# Patient Record
Sex: Female | Born: 1955 | Race: White | Hispanic: No | Marital: Married | Smoking: Never smoker
Health system: Southern US, Community
[De-identification: ages and names within clinical notes are randomized; demographics above are authoritative.]

## PROBLEM LIST (undated history)

## (undated) DIAGNOSIS — T7840XA Allergy, unspecified, initial encounter: Secondary | ICD-10-CM

## (undated) DIAGNOSIS — J449 Chronic obstructive pulmonary disease, unspecified: Secondary | ICD-10-CM

## (undated) DIAGNOSIS — J42 Unspecified chronic bronchitis: Secondary | ICD-10-CM

## (undated) DIAGNOSIS — I8390 Asymptomatic varicose veins of unspecified lower extremity: Secondary | ICD-10-CM

## (undated) DIAGNOSIS — L723 Sebaceous cyst: Secondary | ICD-10-CM

## (undated) DIAGNOSIS — M7130 Other bursal cyst, unspecified site: Secondary | ICD-10-CM

## (undated) DIAGNOSIS — M810 Age-related osteoporosis without current pathological fracture: Secondary | ICD-10-CM

## (undated) DIAGNOSIS — M1990 Unspecified osteoarthritis, unspecified site: Secondary | ICD-10-CM

## (undated) DIAGNOSIS — J069 Acute upper respiratory infection, unspecified: Secondary | ICD-10-CM

## (undated) DIAGNOSIS — N6019 Diffuse cystic mastopathy of unspecified breast: Secondary | ICD-10-CM

## (undated) HISTORY — DX: Asymptomatic varicose veins of unspecified lower extremity: I83.90

## (undated) HISTORY — DX: Age-related osteoporosis without current pathological fracture: M81.0

## (undated) HISTORY — DX: Other bursal cyst, unspecified site: M71.30

## (undated) HISTORY — DX: Unspecified chronic bronchitis: J42

## (undated) HISTORY — DX: Chronic obstructive pulmonary disease, unspecified: J44.9

## (undated) HISTORY — DX: Allergy, unspecified, initial encounter: T78.40XA

## (undated) HISTORY — DX: Sebaceous cyst: L72.3

## (undated) HISTORY — DX: Unspecified osteoarthritis, unspecified site: M19.90

## (undated) HISTORY — DX: Acute upper respiratory infection, unspecified: J06.9

---

## 2007-04-22 HISTORY — PX: TONSILLECTOMY AND ADENOIDECTOMY: SUR1326

## 2010-03-21 ENCOUNTER — Ambulatory Visit: Payer: Self-pay | Admitting: Family Medicine

## 2013-03-09 ENCOUNTER — Ambulatory Visit: Payer: Self-pay | Admitting: Family Medicine

## 2013-07-23 LAB — HM MAMMOGRAPHY: HM MAMMO: NORMAL

## 2015-01-27 ENCOUNTER — Encounter: Payer: Self-pay | Admitting: Family Medicine

## 2015-01-27 DIAGNOSIS — M810 Age-related osteoporosis without current pathological fracture: Secondary | ICD-10-CM | POA: Insufficient documentation

## 2015-01-27 DIAGNOSIS — J302 Other seasonal allergic rhinitis: Secondary | ICD-10-CM | POA: Insufficient documentation

## 2015-01-27 DIAGNOSIS — I83813 Varicose veins of bilateral lower extremities with pain: Secondary | ICD-10-CM | POA: Insufficient documentation

## 2015-01-27 DIAGNOSIS — J45909 Unspecified asthma, uncomplicated: Secondary | ICD-10-CM | POA: Insufficient documentation

## 2015-01-27 DIAGNOSIS — I8390 Asymptomatic varicose veins of unspecified lower extremity: Secondary | ICD-10-CM | POA: Insufficient documentation

## 2015-01-27 DIAGNOSIS — M545 Low back pain, unspecified: Secondary | ICD-10-CM | POA: Insufficient documentation

## 2015-01-28 ENCOUNTER — Encounter: Payer: Self-pay | Admitting: Family Medicine

## 2015-01-28 ENCOUNTER — Ambulatory Visit (INDEPENDENT_AMBULATORY_CARE_PROVIDER_SITE_OTHER): Payer: 59 | Admitting: Family Medicine

## 2015-01-28 VITALS — BP 132/68 | HR 83 | Temp 98.2°F | Resp 18 | Ht 65.0 in | Wt 101.0 lb

## 2015-01-28 DIAGNOSIS — J40 Bronchitis, not specified as acute or chronic: Secondary | ICD-10-CM

## 2015-01-28 MED ORDER — BENZONATATE 100 MG PO CAPS: 100.0000 mg | ORAL_CAPSULE | Freq: Three times a day (TID) | ORAL | Status: DC

## 2015-01-28 MED ORDER — FLUTICASONE FUROATE-VILANTEROL 100-25 MCG/INH IN AEPB: 1.0000 | INHALATION_SPRAY | Freq: Every day | RESPIRATORY_TRACT | Status: DC

## 2015-01-28 MED ORDER — PREDNISONE 20 MG PO TABS: 20.0000 mg | ORAL_TABLET | Freq: Two times a day (BID) | ORAL | Status: DC

## 2015-01-28 NOTE — Progress Notes (Signed)
Name: Kristina Christian   MRN: [id number]    DOB: 02-04-1956   Date:01/28/2015       Progress Note  Subjective  Chief Complaint  Chief Complaint  Patient presents with  . URI    Onset since July 4, sore throat, thick greenish mucus, cough, nasal drainage, when patient breaths her back aches, worsening. OTC-Mucinex, [place].    HPI  Bronchitis: she has a history of RAD. She states that she developed a sore throat over the holiday's followed by a productive cough, no wheezing , SOB or fever. She states that otc medication has not been helping. She states she usually needs to round of antibiotics to feel better once she gets this.   Patient Active Problem List   Diagnosis Date Noted  . LBP (low back pain) 01/27/2015  . Osteopenia 01/27/2015  . Leg varices 01/27/2015  . Allergic rhinitis, seasonal 01/27/2015  . RAD (reactive airway disease) 01/27/2015    History  Substance Use Topics  . Smoking status: Never Smoker   . Smokeless tobacco: Never Used  . Alcohol Use: No     Current outpatient prescriptions:  .  b complex vitamins capsule, Take 1 capsule by mouth daily., Disp: , Rfl:  .  BIOFLAVONOID PRODUCTS ER PO, Take by mouth., Disp: , Rfl:  .  calcium carbonate (OS-CAL) 600 MG TABS tablet, Take by mouth., Disp: , Rfl:  .  fluticasone (FLONASE) 50 MCG/ACT nasal spray, Place into the nose., Disp: , Rfl:  .  MULTIPLE VITAMINS PO, Take by mouth., Disp: , Rfl:  .  vitamin C (ASCORBIC ACID) 500 MG tablet, Take 500 mg by mouth daily., Disp: , Rfl:   No Known Allergies  ROS  Ten systems reviewed and is negative except as mentioned in HPI   Objective  Filed Vitals:   01/28/15 0946  BP: 132/68  Pulse: 83  Temp: 98.2 F (36.8 C)  TempSrc: Oral  Resp: 18  Height: 5\' 5"  (1.651 m)  Weight: 101 lb (45.813 kg)  SpO2: 97%    Body mass index is 16.81 kg/(m^2).    Physical Exam  Constitutional: Patient appears well-developed and well-nourished. No distress.  Eyes:  No  scleral icterus. PERL Neck: Normal range of motion. Neck supple. Cardiovascular: Normal rate, regular rhythm and normal heart sounds.  No murmur heard. No BLE edema. Pulmonary/Chest: Effort normal and breath sounds normal. No respiratory distress. Abdominal: Soft.  There is no tenderness. Psychiatric: Patient has a normal mood and affect. behavior is normal. Judgment and thought content normal.     Assessment & Plan  1. Bronchitis Explained that it is likely viral and will last at least 3 weeks. We will give her prednisone and inhaler to decrease inflammation, cough suppressant and call back if no improvement.  - predniSONE (DELTASONE) 20 MG tablet; Take 1 tablet (20 mg total) by mouth 2 (two) times daily with a meal.  Dispense: 10 tablet; Refill: 0 - benzonatate (TESSALON) 100 MG capsule; Take 1 capsule (100 mg total) by mouth 3 (three) times daily.  Dispense: 40 capsule; Refill: 0 - Fluticasone Furoate-Vilanterol 100-25 MCG/INH AEPB; Inhale 1 puff into the lungs daily.  Dispense: 1 each; Refill: 0

## 2015-01-28 NOTE — Patient Instructions (Signed)

## 2015-01-31 ENCOUNTER — Telehealth: Payer: Self-pay | Admitting: Family Medicine

## 2015-01-31 NOTE — Telephone Encounter (Signed)
Please advise on instructions for patient or if you would like to start her on a antibiotic.

## 2015-01-31 NOTE — Telephone Encounter (Signed)
Patient called stating she wanted to give you an update. She states the prednisone has helped with her lungs, phelm has came up until last night and now seems like it wont move. She states she would like the antibiotics to be sent to [facility]. Pt also still has a pretty bad cough. Cough meds seem to be helping some.

## 2015-02-01 ENCOUNTER — Telehealth: Payer: Self-pay | Admitting: Family Medicine

## 2015-02-01 MED ORDER — AZITHROMYCIN 250 MG PO TABS: ORAL_TABLET | ORAL | Status: DC

## 2015-02-01 NOTE — Telephone Encounter (Signed)
Tried to contact patient back regarding her concerns, but there was no answer. A message was left for her to give [place] a call back when she got the chance.

## 2015-02-01 NOTE — Telephone Encounter (Signed)
Sent zpack

## 2015-02-01 NOTE — Telephone Encounter (Signed)
Patient has some concerns about a inhaler. Please return her call.

## 2015-02-02 ENCOUNTER — Telehealth: Payer: Self-pay

## 2015-02-02 NOTE — Telephone Encounter (Signed)
I suggest her to follow up with Dr. Nadine Counts this week . Thank you

## 2015-02-02 NOTE — Telephone Encounter (Signed)
Patient notified

## 2015-02-02 NOTE — Telephone Encounter (Signed)
Could you see if patient would like to come back to see Dr. Nadine Counts, I already informed patient Kristina Christian was the best choice for her symptoms and Dr. Ancil Boozer does not feel comfortable with a antibiotic or a rescue inhaler. But if she would like she could follow up with Dr. Nadine Counts. Thanks.

## 2015-02-02 NOTE — Telephone Encounter (Signed)
Patient states Z-pak does not work for patient, but Augmentin has helped her in the past could you please switch?

## 2015-02-02 NOTE — Telephone Encounter (Signed)
Patient is worried about the Breo-reading the side effects about causing Osteopenia and patient already has it. She is worried about taking it, and worried about breathing. Could you send her in a rescue inhaler incase patient has trouble breathing with the Breo as the side effects warn patients.

## 2015-02-02 NOTE — Telephone Encounter (Signed)
She can try Zpack or just wait and use Breo, I will not switch to Augmentin at this time

## 2015-02-03 ENCOUNTER — Other Ambulatory Visit: Payer: Self-pay | Admitting: Family Medicine

## 2015-02-03 DIAGNOSIS — J209 Acute bronchitis, unspecified: Secondary | ICD-10-CM | POA: Insufficient documentation

## 2015-02-03 MED ORDER — ALBUTEROL SULFATE HFA 108 (90 BASE) MCG/ACT IN AERS: 2.0000 | INHALATION_SPRAY | RESPIRATORY_TRACT | Status: DC | PRN

## 2015-02-03 NOTE — Telephone Encounter (Signed)
Reviewed recent office visit and patient's requests. Will send in albuterol rescue inhaler.

## 2015-02-03 NOTE — Telephone Encounter (Signed)
Patient returned your call. [phone] (until [place]) [phone] (after 930)

## 2015-02-03 NOTE — Telephone Encounter (Signed)
Refill request was sent to Dr. Ashany Sundaram for approval and submission.  

## 2015-02-04 NOTE — Telephone Encounter (Signed)
On yesterday, after I consulted with Dr. Nadine Counts, I contacted patient the patient and listened to her concerns. I then sent a rx request to Dr. Nadine Counts for her to approve and submit. Patient was informed.

## 2015-02-04 NOTE — Telephone Encounter (Signed)
Inhaler sent to [place] yesterday.

## 2015-02-07 ENCOUNTER — Ambulatory Visit
Admission: RE | Admit: 2015-02-07 | Discharge: 2015-02-07 | Disposition: A | Discharge status: Home / Self Care | Payer: 59 | Source: Ambulatory Visit | Attending: Family Medicine | Admitting: Family Medicine

## 2015-02-07 ENCOUNTER — Ambulatory Visit (INDEPENDENT_AMBULATORY_CARE_PROVIDER_SITE_OTHER): Payer: 59 | Admitting: Family Medicine

## 2015-02-07 ENCOUNTER — Encounter: Payer: Self-pay | Admitting: Family Medicine

## 2015-02-07 VITALS — BP 112/68 | HR 86 | Temp 97.9°F | Resp 16 | Wt 100.6 lb

## 2015-02-07 DIAGNOSIS — J209 Acute bronchitis, unspecified: Secondary | ICD-10-CM

## 2015-02-07 DIAGNOSIS — J449 Chronic obstructive pulmonary disease, unspecified: Secondary | ICD-10-CM | POA: Diagnosis not present

## 2015-02-07 DIAGNOSIS — R0789 Other chest pain: Secondary | ICD-10-CM | POA: Diagnosis not present

## 2015-02-07 DIAGNOSIS — J029 Acute pharyngitis, unspecified: Secondary | ICD-10-CM

## 2015-02-07 DIAGNOSIS — J40 Bronchitis, not specified as acute or chronic: Secondary | ICD-10-CM

## 2015-02-07 MED ORDER — AMOXICILLIN-POT CLAVULANATE 875-125 MG PO TABS: 1.0000 | ORAL_TABLET | Freq: Two times a day (BID) | ORAL | Status: DC

## 2015-02-07 MED ORDER — PREDNISONE 10 MG (21) PO TBPK: ORAL_TABLET | ORAL | Status: DC

## 2015-02-07 NOTE — Telephone Encounter (Signed)
Patient came in for follow up with Dr. Nadine Counts today.

## 2015-02-07 NOTE — Patient Instructions (Signed)

## 2015-02-07 NOTE — Addendum Note (Signed)
Addended by: Bobetta Lime on: 02/07/2015 12:43 PM   Modules accepted: Orders, Medications

## 2015-02-07 NOTE — Progress Notes (Signed)
Name: Kristina Christian   MRN: [id number]    DOB: 1956-06-17   Date:02/07/2015       Progress Note  Subjective  Chief Complaint  Chief Complaint  Patient presents with  . Follow-up    patient is here for a re-check from 01/28/15. Patient has history of bronchitis & pneumonia.    HPI  Patient is here today with concerns regarding the following symptoms sore throat, post nasal drip, non productive cough and achiness that started months ago.  Associated with fatigue and malaise. Not associated with fevers, nausea, vomiting, unwanted weight loss, rash, sick contact. Has tried the following home remedies: Breo inhaler, Prednisone taper pack with minimal relief. Although symptoms are not getting worse. She has not been using her anti-histamine or flonase and does note waking up in the mornings with eye puffiness and scant discharge. She also notes right midline (just under breast) chest wall pain with deep breaths.     Past Medical History  Diagnosis Date  . Varicose veins of lower extremity   . Allergy   . Sebaceous cyst   . Synovial cyst   . Acute upper respiratory infection     History  Substance Use Topics  . Smoking status: Never Smoker   . Smokeless tobacco: Never Used  . Alcohol Use: No     Current outpatient prescriptions:  .  albuterol (PROVENTIL HFA;VENTOLIN HFA) 108 (90 BASE) MCG/ACT inhaler, Inhale 2 puffs into the lungs every 4 (four) hours as needed for wheezing or shortness of breath., Disp: 1 Inhaler, Rfl: 1 .  azithromycin (ZITHROMAX Z-PAK) 250 MG tablet, Take as directe, Disp: 6 each, Rfl: 0 .  b complex vitamins capsule, Take 1 capsule by mouth daily., Disp: , Rfl:  .  benzonatate (TESSALON) 100 MG capsule, Take 1 capsule (100 mg total) by mouth 3 (three) times daily., Disp: 40 capsule, Rfl: 0 .  BIOFLAVONOID PRODUCTS ER PO, Take by mouth., Disp: , Rfl:  .  calcium carbonate (OS-CAL) 600 MG TABS tablet, Take by mouth., Disp: , Rfl:  .  fluticasone (FLONASE) 50 MCG/ACT  nasal spray, Place into the nose., Disp: , Rfl:  .  Fluticasone Furoate-Vilanterol 100-25 MCG/INH AEPB, Inhale 1 puff into the lungs daily., Disp: 1 each, Rfl: 0 .  MULTIPLE VITAMINS PO, Take by mouth., Disp: , Rfl:  .  predniSONE (DELTASONE) 20 MG tablet, Take 1 tablet (20 mg total) by mouth 2 (two) times daily with a meal., Disp: 10 tablet, Rfl: 0 .  vitamin C (ASCORBIC ACID) 500 MG tablet, Take 500 mg by mouth daily., Disp: , Rfl:  .  amoxicillin-clavulanate (AUGMENTIN) 875-125 MG per tablet, Take 1 tablet by mouth 2 (two) times daily., Disp: 20 tablet, Rfl: 0  No Known Allergies  ROS  10 Systems reviewed and is negative except as mentioned in HPI.   Objective  Filed Vitals:   02/07/15 1108  BP: 112/68  Pulse: 86  Temp: 97.9 F (36.6 C)  TempSrc: Oral  Resp: 16  Weight: 100 lb 9.6 oz (45.632 kg)  SpO2: 97%   Body mass index is 16.74 kg/(m^2).   Physical Exam  Constitutional: Patient appears well-developed and well-nourished. In no acute distress but does appear to be fatigued from acute illness. HEENT:  - Head: Normocephalic and atraumatic.  - Ears: RIGHT TM bulging with minimal clear exudate, LEFT TM bulging with minimal clear exudate.  - Nose: Nasal mucosa boggy and congested.  - Mouth/Throat: Oropharynx is moist with slight erythema of bilateral  tonsils without hypertrophy or exudates. Post nasal drainage present.  - Eyes: Conjunctivae clear, EOM movements normal. PERRLA. No scleral icterus.  Neck: Normal range of motion. Neck supple. No JVD present. No thyromegaly present. No local lymphadenopathy. Cardiovascular: Regular rate, regular rhythm with no murmurs heard.  Pulmonary/Chest: Effort normal and breath sounds clear in all lung fields.  Musculoskeletal: Normal range of motion bilateral UE and LE, no joint effusions. Skin: Skin is warm and dry. No rash noted. Psychiatric: Patient has a normal mood and affect. Behavior is normal in office today. Judgment and  thought content normal in office today.   Assessment & Plan  1. Right-sided chest wall pain Likely pleuritic in nature, will get CXR to rule out infiltrate.  - DG Chest 2 View; Future  2. Sore throat Likely related to allergies, restart antihistamine and flonase.  - DG Chest 2 View; Future  3. Bronchitis with bronchospasm Will send in new abx (Zpacks do not work for her per patient and she did not pick up and use last RX of Zpack sent in 2 weeks ago) Augmentin to have on hold at the pharmacy if needed.  - amoxicillin-clavulanate (AUGMENTIN) 875-125 MG per tablet; Take 1 tablet by mouth 2 (two) times daily.  Dispense: 20 tablet; Refill: 0 - DG Chest 2 View; Future

## 2015-02-14 ENCOUNTER — Telehealth: Payer: Self-pay | Admitting: Family Medicine

## 2015-02-14 NOTE — Telephone Encounter (Signed)
Pt needs referral to [facility], Dr Philis Kendall. She does not have an appt but she says its time for her yearly eye exam.

## 2015-02-15 NOTE — Telephone Encounter (Signed)
Appt is 03/01/15.

## 2015-02-16 NOTE — Telephone Encounter (Signed)
[facility] and notified them of authorization Dr. Dawna Part, also notified the patient.  [id number] Start - 02/16/2015 Expires - 08/19/2015

## 2015-03-21 ENCOUNTER — Telehealth: Payer: Self-pay | Admitting: Family Medicine

## 2015-03-21 NOTE — Telephone Encounter (Signed)
Patient is requesting a referral to see Dr Oneta Rack at [facility]. You had seen her before for a cyst. It has become very painful. When i told her that the referral coordinator had 7 days to do the referral she became nervous and wanted to know what to do. So to ease her mind i told her that she could schedule an appointment with you for tomorrow, (and she did) but still would like the referral placed

## 2015-03-21 NOTE — Telephone Encounter (Signed)
Will evaluate patient tomorrow and place referral if needed.

## 2015-03-22 ENCOUNTER — Ambulatory Visit (INDEPENDENT_AMBULATORY_CARE_PROVIDER_SITE_OTHER): Payer: 59 | Admitting: Family Medicine

## 2015-03-22 ENCOUNTER — Encounter: Payer: Self-pay | Admitting: Family Medicine

## 2015-03-22 VITALS — BP 116/78 | HR 74 | Temp 98.1°F | Resp 16 | Ht 65.0 in | Wt 102.6 lb

## 2015-03-22 DIAGNOSIS — L723 Sebaceous cyst: Secondary | ICD-10-CM

## 2015-03-22 DIAGNOSIS — L570 Actinic keratosis: Secondary | ICD-10-CM

## 2015-03-22 DIAGNOSIS — X32XXXA Exposure to sunlight, initial encounter: Secondary | ICD-10-CM

## 2015-03-22 DIAGNOSIS — L089 Local infection of the skin and subcutaneous tissue, unspecified: Secondary | ICD-10-CM | POA: Insufficient documentation

## 2015-03-22 HISTORY — DX: Actinic keratosis: L57.0

## 2015-03-22 NOTE — Progress Notes (Deleted)
Name: Kristina Christian   MRN: [id number]    DOB: 1955-08-23   Date:03/22/2015       Progress Note  Subjective  Chief Complaint  Chief Complaint  Patient presents with  . Cyst    sebaceous cyst on the top of the right shoulder. patient stated it has grown in size and is very painful.    HPI  Kristina Christian is a 59 year old female with a know history of seasonal allergies, osteopenia, occasional low back pain and reactive airways when having URI.  She is here today with concerns regarding the following symptoms red inflammation lesion on the left/right lower/upper eye lid, swelling, mild discharge, pain of the right eye. No fevers, vomiting, headaches or visual loss but there is some blurring of vision in affected eye. Has not tried any home remedies. No positive sick contacts with similar symptoms.    Past Medical History  Diagnosis Date  . Varicose veins of lower extremity   . Allergy   . Sebaceous cyst   . Synovial cyst   . Acute upper respiratory infection     Social History  Substance Use Topics  . Smoking status: Never Smoker   . Smokeless tobacco: Never Used  . Alcohol Use: No     Current outpatient prescriptions:  .  albuterol (PROVENTIL HFA;VENTOLIN HFA) 108 (90 BASE) MCG/ACT inhaler, Inhale 2 puffs into the lungs every 4 (four) hours as needed for wheezing or shortness of breath., Disp: 1 Inhaler, Rfl: 1 .  b complex vitamins capsule, Take 1 capsule by mouth daily., Disp: , Rfl:  .  BIOFLAVONOID PRODUCTS ER PO, Take by mouth., Disp: , Rfl:  .  calcium carbonate (OS-CAL) 600 MG TABS tablet, Take by mouth., Disp: , Rfl:  .  fluticasone (FLONASE) 50 MCG/ACT nasal spray, Place into the nose., Disp: , Rfl:  .  Fluticasone Furoate-Vilanterol 100-25 MCG/INH AEPB, Inhale 1 puff into the lungs daily., Disp: 1 each, Rfl: 0 .  MULTIPLE VITAMINS PO, Take by mouth., Disp: , Rfl:  .  vitamin C (ASCORBIC ACID) 500 MG tablet, Take 500 mg by mouth daily., Disp: , Rfl:   No Known  Allergies  ROS  10 Systems reviewed and is negative except as mentioned in HPI.   Objective  Filed Vitals:   03/22/15 1514  BP: 116/78  Pulse: 74  Temp: 98.1 F (36.7 C)  TempSrc: Oral  Resp: 16  Weight: 102 lb 9.6 oz (46.539 kg)  SpO2: 98%   Body mass index is 17.07 kg/(m^2).   Physical Exam  Constitutional: Patient appears well-developed and well-nourished. In no distress.  HEENT:  - Head: Normocephalic and atraumatic.  - Ears: Bilateral TMs gray, no erythema or effusion - Nose: Nasal mucosa moist, boggy and congested. - Mouth/Throat: Oropharynx is clear and moist. No tonsillar hypertrophy or erythema. No post nasal drainage.  - Eyes: {Right/Left/Bilateral:[id number]} conjunctivae congested, injected, dull yellow discharge on eyelashes present. EOM movements normal. PERRLA. No scleral icterus.  Neck: Normal range of motion. Neck supple. No JVD present. No thyromegaly present.  Cardiovascular: Normal rate, regular rhythm and normal heart sounds.  No murmur heard.  Pulmonary/Chest: Effort normal and breath sounds normal. No respiratory distress. Musculoskeletal: Normal range of motion bilateral UE and LE, no joint effusions. Peripheral vascular: Bilateral LE no edema. Neurological: CN II-XII grossly intact with no focal deficits. Alert and oriented to person, place, and time. Coordination, balance, strength, speech and gait are normal.  Skin: Skin is warm and dry. No  rash noted. No erythema.  Psychiatric: Patient has a normal mood and affect. Behavior is normal in office today. Judgment and thought content normal in office today.   Assessment & Plan  Instructed patient on using warm/cold compressors on affect eye(s). Be mindful of washing hands frequently to avoid spreading possible infection. Tylenol or NSAID as needed for pain, inflammation, headache. If symptoms worsen contact PCP office for referral to Ophthamologist.

## 2015-03-22 NOTE — Progress Notes (Signed)
Name: Kristina Christian   MRN: [id number]    DOB: 03/12/1956   Date:03/22/2015       Progress Note  Subjective  Chief Complaint  Chief Complaint  Patient presents with  . Cyst    sebaceous cyst on the top of the right shoulder. patient stated it has grown in size and is very painful.    HPI  Kristina Christian is a 59 year old female with a known history of osteopenia, allergic rhinitis, varicose veins, reactive airways with URIs who is here today to discuss ongoing issues with recurrent cyst infection. Located right shoulder near her bra line. Onset of swelling, pain, redness 5 days ago. Not associated with spontaneous drainage or fevers or headaches.  She is interested in having the area drained but not removed as she would like to consult with Dermatology or Gen Surg if needed for cyst capsule removal.    She also wanted to mention that Breo caused her to have a soreness in her throat and so she discontinued it after 4 days. The Ventolin helped when she was sick.  Patient Active Problem List   Diagnosis Date Noted  . Infected sebaceous cyst of skin 03/22/2015  . Sun-induced skin changes, keratosis 03/22/2015  . LBP (low back pain) 01/27/2015  . Osteopenia 01/27/2015  . Leg varices 01/27/2015  . Allergic rhinitis, seasonal 01/27/2015  . RAD (reactive airway disease) 01/27/2015    Social History  Substance Use Topics  . Smoking status: Never Smoker   . Smokeless tobacco: Never Used  . Alcohol Use: No     Current outpatient prescriptions:  .  albuterol (PROVENTIL HFA;VENTOLIN HFA) 108 (90 BASE) MCG/ACT inhaler, Inhale 2 puffs into the lungs every 4 (four) hours as needed for wheezing or shortness of breath., Disp: 1 Inhaler, Rfl: 1 .  b complex vitamins capsule, Take 1 capsule by mouth daily., Disp: , Rfl:  .  BIOFLAVONOID PRODUCTS ER PO, Take by mouth., Disp: , Rfl:  .  calcium carbonate (OS-CAL) 600 MG TABS tablet, Take by mouth., Disp: , Rfl:  .  fluticasone (FLONASE) 50 MCG/ACT  nasal spray, Place into the nose., Disp: , Rfl:  .  Fluticasone Furoate-Vilanterol 100-25 MCG/INH AEPB, Inhale 1 puff into the lungs daily., Disp: 1 each, Rfl: 0 .  MULTIPLE VITAMINS PO, Take by mouth., Disp: , Rfl:  .  vitamin C (ASCORBIC ACID) 500 MG tablet, Take 500 mg by mouth daily., Disp: , Rfl:   No Known Allergies  Review of Systems  CONSTITUTIONAL: No significant weight changes, fever, chills, weakness or fatigue.  HEENT:  - Eyes: No visual changes.  - Ears: No auditory changes. No pain.  - Nose: No sneezing, congestion, runny nose. - Throat: No sore throat. No changes in swallowing. SKIN: Yes re occuring lesion. CARDIOVASCULAR: No chest pain, chest pressure or chest discomfort. No palpitations or edema.  RESPIRATORY: No shortness of breath, cough or sputum.  GASTROINTESTINAL: No anorexia, nausea, vomiting. No changes in bowel habits. No abdominal pain or blood.  NEUROLOGICAL: No headache, dizziness, syncope, paralysis, ataxia, numbness or tingling in the extremities. No memory changes. No change in bowel or bladder control.  MUSCULOSKELETAL: No joint pain. No muscle pain. HEMATOLOGIC: No anemia, bleeding or bruising.  LYMPHATICS: No enlarged lymph nodes.  PSYCHIATRIC: No change in mood. No change in sleep pattern.    Objective  BP 116/78 mmHg  Pulse 74  Temp(Src) 98.1 F (36.7 C) (Oral)  Resp 16  Wt 102 lb 9.6 oz (46.539  kg)  SpO2 98%  Body mass index is 17.07 kg/(m^2).   Physical Exam  Constitutional: Patient is lean and well-nourished. In no distress.  HEENT:  - Head: Normocephalic and atraumatic.  Neck: Normal range of motion. Neck supple. No JVD present. No thyromegaly present.  RIGHT SHOULDER LESION: over trapezius muscle just medial to bra strap a raised red, 2cm lesion with punctate pus nearly coming to a head but not opened or draining. Associated with tenderness. No induration of surrounding skin. Cardiovascular: Normal rate, regular rhythm and normal  heart sounds.  No murmur heard.  Pulmonary/Chest: Effort normal and breath sounds normal. No respiratory distress. Musculoskeletal: Normal range of motion bilateral UE and LE, no joint effusions. Peripheral vascular: Bilateral LE no edema. Neurological: CN II-XII grossly intact with no focal deficits. Alert and oriented to person, place, and time. Coordination, balance, strength, speech and gait are normal.  Skin: Skin is warm and dry with scattered macules and keratosis changes on face, chest, shoulders, back, arms.  Psychiatric: Patient has a normal mood and affect. Behavior is normal in office today. Judgment and thought content normal in office today.   Assessment & Plan  1. Infected sebaceous cyst of skin The patient complains of pain, tenderness, redness, swelling, open skin wound with discharge, infection of skin. No fevers, chills, short of breath, headache, lethargy, change in mentation, chest pain or palpitations. The patient has tried other modalities of treatment including topical neosporin, cleansing skin and/or warm compressors.    Duration of symptoms: 5 Indication for Incision and Drainage: Abscess Consent signed: YES  Procedure: Incision and drainage of abscess Location: Right shoulder Equipment used: sterile scalpel 11 blade Anesthesia: none used Cleaned and prepped: Betadine  After consent signed, affected area of skin prepped with betadine. Punctured 2 areas of prominent pus and purulent material expressed.  Cyst/abscess cavity not opened or probed.  Applied triple antibiotic over area and bandage placed. Instructed patient on home care, do not cover. May drain more. She wants to consult with Dermatology for removal but is symptoms worsen she will call me and I will place her on oral abx and consult general surgery.  - Ambulatory referral to Dermatology  2. Sun-induced skin changes, keratosis  - Ambulatory referral to Dermatology

## 2015-03-22 NOTE — Patient Instructions (Signed)
Epidermal Cyst An epidermal cyst is sometimes called a sebaceous cyst, epidermal inclusion cyst, or infundibular cyst. These cysts usually contain a substance that looks "pasty" or "cheesy" and may have a bad smell. This substance is a protein called keratin. Epidermal cysts are usually found on the face, neck, or trunk. They may also occur in the vaginal area or other parts of the genitalia of both men and women. Epidermal cysts are usually small, painless, slow-growing bumps or lumps that move freely under the skin. It is important not to try to pop them. This may cause an infection and lead to tenderness and swelling. CAUSES  Epidermal cysts may be caused by a deep penetrating injury to the skin or a plugged hair follicle, often associated with acne. SYMPTOMS  Epidermal cysts can become inflamed and cause:  Redness.  Tenderness.  Increased temperature of the skin over the bumps or lumps.  Grayish-white, bad smelling material that drains from the bump or lump. DIAGNOSIS  Epidermal cysts are easily diagnosed by your caregiver during an exam. Rarely, a tissue sample (biopsy) may be taken to rule out other conditions that may resemble epidermal cysts. TREATMENT   Epidermal cysts often get better and disappear on their own. They are rarely ever cancerous.  If a cyst becomes infected, it may become inflamed and tender. This may require opening and draining the cyst. Treatment with antibiotics may be necessary. When the infection is gone, the cyst may be removed with minor surgery.  Small, inflamed cysts can often be treated with antibiotics or by injecting steroid medicines.  Sometimes, epidermal cysts become large and bothersome. If this happens, surgical removal in your caregiver's office may be necessary. HOME CARE INSTRUCTIONS  Only take over-the-counter or prescription medicines as directed by your caregiver.  Take your antibiotics as directed. Finish them even if you start to feel  better. SEEK MEDICAL CARE IF:   Your cyst becomes tender, red, or swollen.  Your condition is not improving or is getting worse.  You have any other questions or concerns. MAKE SURE YOU:  Understand these instructions.  Will watch your condition.  Will get help right away if you are not doing well or get worse. Document Released: 06/09/2004 Document Revised: 10/01/2011 Document Reviewed: 01/15/2011 Exit[employer] Patient Information 2015 [employer]. This information is not intended to replace advice given to you by your health care provider. Make sure you discuss any questions you have with your health care provider.  

## 2015-08-05 ENCOUNTER — Telehealth: Payer: Self-pay

## 2015-08-05 NOTE — Telephone Encounter (Signed)
Patient called to speak with me about her referral with Dr. Kellie Moor back in August. She said that when she spoke with me back then I had confirmed that her referral for [facility] was done and complete, she had 6 visits to [facility] and As I look back as her Referral as I had told her back in Aug her referral was done Referral # [id number] Start date- 03/22/15 - 09/18/15. She says she has received a bill from [facility] for $600.00 b/c [facility] says a referral was never completed and as I told her in August I told her again that the referral was complete. She also someone had filed an appeal on her behalf and I stated that I had no knowledge of this and more than likely this wasn't our office as Dr. Nadine Counts has been out of the office all week. She would need to talk to Delaney Meigs about her bill since they are the one's who billed her insurance company and not our office. I simply complete the referral as requested. She understood and will follow-up with [facility].

## 2015-09-12 ENCOUNTER — Ambulatory Visit: Payer: Self-pay | Admitting: Family Medicine

## 2015-09-19 ENCOUNTER — Encounter: Payer: Self-pay | Admitting: Family Medicine

## 2015-09-19 ENCOUNTER — Ambulatory Visit (INDEPENDENT_AMBULATORY_CARE_PROVIDER_SITE_OTHER): Payer: BLUE CROSS/BLUE SHIELD | Admitting: Family Medicine

## 2015-09-19 VITALS — BP 122/84 | HR 71 | Temp 98.0°F | Resp 16 | Ht 65.0 in | Wt 107.8 lb

## 2015-09-19 DIAGNOSIS — J392 Other diseases of pharynx: Secondary | ICD-10-CM | POA: Insufficient documentation

## 2015-09-19 DIAGNOSIS — R2233 Localized swelling, mass and lump, upper limb, bilateral: Secondary | ICD-10-CM | POA: Insufficient documentation

## 2015-09-19 DIAGNOSIS — L209 Atopic dermatitis, unspecified: Secondary | ICD-10-CM | POA: Diagnosis not present

## 2015-09-19 HISTORY — DX: Localized swelling, mass and lump, upper limb, bilateral: R22.33

## 2015-09-19 MED ORDER — HYDROCORTISONE VALERATE 0.2 % EX CREA: 1.0000 "application " | TOPICAL_CREAM | Freq: Two times a day (BID) | CUTANEOUS | Status: DC

## 2015-09-19 NOTE — Progress Notes (Signed)
Name: Kristina Christian   MRN: [id number]    DOB: 19-Apr-1956   Date:09/19/2015       Progress Note  Subjective  Chief Complaint  Chief Complaint  Patient presents with  . Gastroesophageal Reflux    Feels like something is stuck in throat after eating for 4 weeks  . Hand Swelling    HPI  Kristina Christian is a 60 year old female who reports about 1 month of food stuck in back of throat sensation, left sided. May be associated with more frequent use of nasal saline spray for her allergies with post nasal drip. No choking sensation or chest pain or epigastric pain. Now symptoms resolved on their own.   She also reports nodules at the tips of some of her fingers of both hands, not painful. Mother had similar arthritis. More recently she has little red raised blisters on her right hand pinky that burst with clear fluid and resolve. Not painful. No warmth or redness.   Past Medical History  Diagnosis Date  . Varicose veins of lower extremity   . Allergy   . Sebaceous cyst   . Synovial cyst   . Acute upper respiratory infection     Patient Active Problem List   Diagnosis Date Noted  . Infected sebaceous cyst of skin 03/22/2015  . Sun-induced skin changes, keratosis 03/22/2015  . LBP (low back pain) 01/27/2015  . Osteopenia 01/27/2015  . Leg varices 01/27/2015  . Allergic rhinitis, seasonal 01/27/2015  . RAD (reactive airway disease) 01/27/2015    Social History  Substance Use Topics  . Smoking status: Never Smoker   . Smokeless tobacco: Never Used  . Alcohol Use: No     Current outpatient prescriptions:  .  albuterol (PROVENTIL HFA;VENTOLIN HFA) 108 (90 BASE) MCG/ACT inhaler, Inhale 2 puffs into the lungs every 4 (four) hours as needed for wheezing or shortness of breath., Disp: 1 Inhaler, Rfl: 1 .  b complex vitamins capsule, Take 1 capsule by mouth daily., Disp: , Rfl:  .  BIOFLAVONOID PRODUCTS ER PO, Take by mouth., Disp: , Rfl:  .  calcium carbonate (OS-CAL) 600 MG TABS tablet,  Take by mouth., Disp: , Rfl:  .  fluticasone (FLONASE) 50 MCG/ACT nasal spray, Place into the nose., Disp: , Rfl:  .  Fluticasone Furoate-Vilanterol 100-25 MCG/INH AEPB, Inhale 1 puff into the lungs daily., Disp: 1 each, Rfl: 0 .  MULTIPLE VITAMINS PO, Take by mouth., Disp: , Rfl:  .  vitamin C (ASCORBIC ACID) 500 MG tablet, Take 500 mg by mouth daily., Disp: , Rfl:   Past Surgical History  Procedure Laterality Date  . Tonsillectomy and adenoidectomy  04/22/2007    Family History  Problem Relation Age of Onset  . Von Willebrand disease Mother     No Known Allergies   Review of Systems  CONSTITUTIONAL: No significant weight changes, fever, chills, weakness or fatigue.  HEENT:  - Eyes: No visual changes.  - Ears: No auditory changes. No pain.  - Nose: No sneezing, congestion, runny nose. - Throat: No sore throat. Yes changes in swallowing. SKIN: Yes rash no itching.  CARDIOVASCULAR: No chest pain, chest pressure or chest discomfort. No palpitations or edema.  RESPIRATORY: No shortness of breath, cough or sputum.  GASTROINTESTINAL: No anorexia, nausea, vomiting. No changes in bowel habits. No abdominal pain or blood.  GENITOURINARY: No dysuria. No frequency. No discharge. NEUROLOGICAL: No headache, dizziness, syncope, paralysis, ataxia, numbness or tingling in the extremities. No memory changes. No change  in bowel or bladder control.  MUSCULOSKELETAL: No joint pain. No muscle pain. HEMATOLOGIC: No anemia, bleeding or bruising.  LYMPHATICS: No enlarged lymph nodes.  PSYCHIATRIC: No change in mood. No change in sleep pattern.  ENDOCRINOLOGIC: No reports of sweating, cold or heat intolerance. No polyuria or polydipsia.     Objective  BP 122/84 mmHg  Pulse 71  Temp(Src) 98 F (36.7 C) (Oral)  Resp 16  Ht 5\' 5"  (1.651 m)  Wt 107 lb 12.8 oz (48.898 kg)  BMI 17.94 kg/m2  SpO2 99% Body mass index is 17.94 kg/(m^2).  Physical Exam  Constitutional: Patient appears  well-developed and well-nourished. In no distress.  HEENT:  - Head: Normocephalic and atraumatic.  - Ears: Bilateral TMs gray, no erythema or effusion - Nose: Nasal mucosa moist - Mouth/Throat: Oropharynx is clear and moist. No tonsillar hypertrophy or erythema. Some post nasal drainage. No food particles in pharynx or tonsillar crypts.  - Eyes: Conjunctivae clear, EOM movements normal. PERRLA. No scleral icterus.  Neck: Normal range of motion. Neck supple. No JVD present. No thyromegaly present.  Cardiovascular: Normal rate, regular rhythm and normal heart sounds.  No murmur heard.  Pulmonary/Chest: Effort normal and breath sounds normal. No respiratory distress. Musculoskeletal: Normal range of motion bilateral UE and LE, no joint effusions. Scattered nodules on DIP joints of hands. Right hand 5th digit 3 raised slightly red lesions with clear fluid in center of one. No pus or tenderness.  Neurological: CN II-XII grossly intact with no focal deficits. Alert and oriented to person, place, and time. Coordination, balance, strength, speech and gait are normal.  Skin: Skin is warm and dry. No rash noted. No erythema.  Psychiatric: Patient has a normal mood and affect (tearful when we talked about me leaving the practice). Behavior is normal in office today. Judgment and thought content normal in office today.   Assessment & Plan  1. Atopic dermatitis Not an infective rash. 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 Avoid washing hands frequently with harsh soaps. Keep well moisturized and use topical hydrocortisone if needed.  - hydrocortisone valerate cream (WESTCORT) 0.2 %; Apply 1 application topically 2 (two) times daily.  Dispense: 15 g; Refill: 1  2. Nodule of finger of both hands Osteoarthritis most likely.   3. Pharyngeal disorder Resolved spontaneously.

## 2016-08-22 ENCOUNTER — Ambulatory Visit
Admission: RE | Admit: 2016-08-22 | Discharge: 2016-08-22 | Disposition: A | Discharge status: Home / Self Care | Payer: BLUE CROSS/BLUE SHIELD | Source: Ambulatory Visit | Attending: Family Medicine | Admitting: Family Medicine

## 2016-08-22 ENCOUNTER — Encounter: Payer: Self-pay | Admitting: Family Medicine

## 2016-08-22 ENCOUNTER — Ambulatory Visit (INDEPENDENT_AMBULATORY_CARE_PROVIDER_SITE_OTHER): Payer: BLUE CROSS/BLUE SHIELD | Admitting: Family Medicine

## 2016-08-22 ENCOUNTER — Telehealth: Payer: Self-pay | Admitting: Family Medicine

## 2016-08-22 DIAGNOSIS — M19042 Primary osteoarthritis, left hand: Secondary | ICD-10-CM | POA: Insufficient documentation

## 2016-08-22 DIAGNOSIS — J392 Other diseases of pharynx: Secondary | ICD-10-CM

## 2016-08-22 DIAGNOSIS — R2233 Localized swelling, mass and lump, upper limb, bilateral: Secondary | ICD-10-CM

## 2016-08-22 DIAGNOSIS — K1379 Other lesions of oral mucosa: Secondary | ICD-10-CM | POA: Diagnosis not present

## 2016-08-22 NOTE — Progress Notes (Signed)
BP 116/78   Pulse 82   Temp 97.8 F (36.6 C) (Oral)   Resp 16   Ht 5' 4.88" (1.648 m)   Wt 106 lb 3.2 oz (48.2 kg)   SpO2 97%   BMI 17.74 kg/m    Subjective:    Patient ID: Kristina Christian, female    DOB: [birth year], 61 y.o.   MRN: [id number]  HPI: Kristina Christian is a 61 y.o. female  Chief Complaint  Patient presents with  . Follow-up    Pt is having reocurring bumps on uvula; Pt has bumps on knuchles   Patient has been feeling bumps on the uvula; going on for over a year; they kind of simmered down, but now feeling it again Feels like something lodged in her throat and kept trying to clear her throat and it wasn't clearing; can feels these lumps She has not seen ENT Lots of sinus drainage; when it gets real dry in the winter, rarely hardly blood tinged, but not hardly this year Has had bronchitis twice a year since moving to [place]; has been using saline though and no episodes of bronchitis this year She has sinus allergies, but not sure; hay fever, eyes start itching, but no formal testing  She went through a spell when she went down to 96 pounds; she has gained it back; she got super thin  No excessive exercise; always been thin; got to 109 pounds one time; she was 102 pounds since age 27 for the most of her life  Bumps have popped up on right middle finger and left index finger; she also sees bumps on DIP Has always had really knobby knuckles; mother has arthritis; not sure if mother has RA, starting to twist a little bit  Depression screen [facility] 2/9 08/22/2016 09/19/2015 01/28/2015  Decreased Interest 0 0 0  Down, Depressed, Hopeless 0 0 0  PHQ - 2 Score 0 0 0   Relevant past medical, surgical, family and social history reviewed Past Medical History:  Diagnosis Date  . Acute upper respiratory infection   . Allergy   . Sebaceous cyst   . Synovial cyst   . Varicose veins of lower extremity    Past Surgical History:  Procedure Laterality Date  . TONSILLECTOMY AND  ADENOIDECTOMY  04/22/2007   Family History  Problem Relation Age of Onset  . Von Willebrand disease Mother    Social History  Substance Use Topics  . Smoking status: Never Smoker  . Smokeless tobacco: Never Used  . Alcohol use No   Interim medical history since last visit reviewed. Allergies and medications reviewed  Review of Systems Per HPI unless specifically indicated above     Objective:    BP 116/78   Pulse 82   Temp 97.8 F (36.6 C) (Oral)   Resp 16   Ht 5' 4.88" (1.648 m)   Wt 106 lb 3.2 oz (48.2 kg)   SpO2 97%   BMI 17.74 kg/m   Wt Readings from Last 3 Encounters:  08/22/16 106 lb 3.2 oz (48.2 kg)  09/19/15 107 lb 12.8 oz (48.9 kg)  03/22/15 102 lb 9.6 oz (46.5 kg)    Physical Exam  Constitutional: She appears well-developed and well-nourished.  HENT:  Mouth/Throat: Mucous membranes are normal.  Eyes: EOM are normal. No scleral icterus.  Neck: No tracheal deviation present. No thyromegaly present.  Cardiovascular: Normal rate and regular rhythm.   Pulmonary/Chest: Effort normal and breath sounds normal.  Musculoskeletal:  Hands: Hard nodule on flexor aspects of R3 and L2; hard nodular swelling DIP most appreciable on R5  Lymphadenopathy:    She has no cervical adenopathy.  Psychiatric: She has a normal mood and affect. Her behavior is normal.    Results for orders placed or performed in visit on 01/27/15  HM MAMMOGRAPHY  Result Value Ref Range   HM Mammogram Normal       Assessment & Plan:   Problem List Items Addressed This Visit      Respiratory   Pharyngeal disorder    Refer to ENT      Relevant Orders   Ambulatory referral to ENT   Disorder of uvula    Refer to ENT for evaluation      Relevant Orders   Ambulatory referral to ENT     Other   Nodule of finger of both hands    Will get xrays of both hands      Relevant Orders   DG Hand Complete Left (Completed)   DG Hand Complete Right (Completed)       Follow up  plan: Return for complete physical.  An after-visit summary was printed and given to the patient at [facility].  Please see the patient instructions which may contain other information and recommendations beyond what is mentioned above in the assessment and plan.  Meds ordered this encounter  Medications  . cholecalciferol (VITAMIN D) 1000 units tablet    Sig: Take 1,000 Units by mouth daily.  Marland Kitchen zinc gluconate 50 MG tablet    Sig: Take 50 mg by mouth daily.  . cetirizine (ZYRTEC) 10 MG tablet    Sig: Take 10 mg by mouth daily.  . fluticasone (FLONASE) 50 MCG/ACT nasal spray    Sig: Place 2 sprays into both nostrils daily.    Orders Placed This Encounter  Procedures  . DG Hand Complete Left  . DG Hand Complete Right  . Ambulatory referral to ENT

## 2016-08-22 NOTE — Patient Instructions (Addendum)
We'll refer you to the [facility] doctor Return for a complete physical in the next few months Go across the street to have xrays done If you have not heard anything from my staff in a week about any orders/referrals/studies from today, please contact us here to follow-up [phone]

## 2016-08-22 NOTE — Assessment & Plan Note (Signed)
Will get xrays of both hands

## 2016-08-22 NOTE — Telephone Encounter (Signed)
Patient was looking at her clinical summary and realized that you still had fluticasone furoate-vilanterol on her medication list. She would like for you to know that she only took it 5 times that she can not take this medication because it caused her to have bad sore throats. Asking that you please take it off her list.  Also patient is being referred to [facility] and is asking that you please schedule the appointment with Dr Beverly Gust

## 2016-08-22 NOTE — Assessment & Plan Note (Signed)
Refer to ENT

## 2016-08-22 NOTE — Assessment & Plan Note (Signed)
Refer to ENT for evaluation 

## 2016-09-13 NOTE — Telephone Encounter (Signed)
Went over patient AVS with patient on 08/22/2016. Took off the list and Pt has been schedule with Dr. Beverly Gust.

## 2017-06-07 ENCOUNTER — Ambulatory Visit: Payer: BLUE CROSS/BLUE SHIELD | Admitting: Family Medicine

## 2017-06-07 ENCOUNTER — Encounter: Payer: Self-pay | Admitting: Family Medicine

## 2017-06-07 DIAGNOSIS — J302 Other seasonal allergic rhinitis: Secondary | ICD-10-CM

## 2017-06-07 DIAGNOSIS — J209 Acute bronchitis, unspecified: Secondary | ICD-10-CM

## 2017-06-07 MED ORDER — PREDNISONE 20 MG PO TABS: 20.0000 mg | ORAL_TABLET | Freq: Every day | ORAL | 0 refills | Status: DC

## 2017-06-07 MED ORDER — BENZONATATE 100 MG PO CAPS: 100.0000 mg | ORAL_CAPSULE | Freq: Two times a day (BID) | ORAL | 0 refills | Status: DC | PRN

## 2017-06-07 MED ORDER — FLUTICASONE PROPIONATE 50 MCG/ACT NA SUSP: 2.0000 | NASAL | 11 refills | Status: DC | PRN

## 2017-06-07 MED ORDER — ALBUTEROL SULFATE HFA 108 (90 BASE) MCG/ACT IN AERS: 2.0000 | INHALATION_SPRAY | RESPIRATORY_TRACT | 1 refills | Status: AC | PRN

## 2017-06-07 NOTE — Assessment & Plan Note (Signed)
Refills provided of nasal corticosteroid

## 2017-06-07 NOTE — Patient Instructions (Addendum)
Start the prednisone You are contagious, so please avoid nursing homes, hospitals, day cares, etc. Use the tessalon perles if needed per directions Try vitamin C (orange juice if not diabetic or vitamin C tablets) and drink green tea to help your immune system during your illness Get plenty of rest and hydration   Acute Bronchitis, Adult Acute bronchitis is when air tubes (bronchi) in the lungs suddenly get swollen. The condition can make it hard to breathe. It can also cause these symptoms:  A cough.  Coughing up clear, yellow, or green mucus.  Wheezing.  Chest congestion.  Shortness of breath.  A fever.  Body aches.  Chills.  A sore throat.  Follow these instructions at home: Medicines  Take over-the-counter and prescription medicines only as told by your doctor.  If you were prescribed an antibiotic medicine, take it as told by your doctor. Do not stop taking the antibiotic even if you start to feel better. General instructions  Rest.  Drink enough fluids to keep your pee (urine) clear or pale yellow.  Avoid smoking and secondhand smoke. If you smoke and you need help quitting, ask your doctor. Quitting will help your lungs heal faster.  Use an inhaler, cool mist vaporizer, or humidifier as told by your doctor.  Keep all follow-up visits as told by your doctor. This is important. How is this prevented? To lower your risk of getting this condition again:  Wash your hands often with soap and water. If you cannot use soap and water, use hand sanitizer.  Avoid contact with people who have cold symptoms.  Try not to touch your hands to your mouth, nose, or eyes.  Make sure to get the flu shot every year.  Contact a doctor if:  Your symptoms do not get better in 2 weeks. Get help right away if:  You cough up blood.  You have chest pain.  You have very bad shortness of breath.  You become dehydrated.  You faint (pass out) or keep feeling like you are  going to pass out.  You keep throwing up (vomiting).  You have a very bad headache.  Your fever or chills gets worse. This information is not intended to replace advice given to you by your health care provider. Make sure you discuss any questions you have with your health care provider. Document Released: 12/26/2007 Document Revised: 02/15/2016 Document Reviewed: 12/28/2015 Elsevier Interactive Patient Education  2017 [employer].

## 2017-06-07 NOTE — Progress Notes (Signed)
BP 126/70 (BP Location: Left Arm, Patient Position: Sitting, Cuff Size: Normal)   Pulse 84   Temp 98.4 F (36.9 C) (Oral)   Ht 5' 4.88" (1.648 m)   Wt 102 lb (46.3 kg)   SpO2 95%   BMI 17.04 kg/m    Subjective:    Patient ID: Kristina Christian, female    DOB: 1956/05/07, 61 y.o.   MRN: [id number]  HPI: Kristina Christian is a 61 y.o. female  Chief Complaint  Patient presents with  . Bronchitis    Pt states thats she has this seasonally     HPI She got sick a few days ago Heavy nonproductive cough; just comes up a little bit once in a while; can't get it to come up She gets this every year, bronchitis yearly mucinex does not really help She has to keep popping ears Had some sore throat and it moves right to the chest No sinus drainage No fevers No rash No asthma as a child; never really sick; after moving from [place] to [place], sick more often Nasal allergies, needs refills of nasal corticosteroid  Depression screen [facility] 2/9 06/07/2017 08/22/2016 09/19/2015 01/28/2015  Decreased Interest 0 0 0 0  Down, Depressed, Hopeless 0 0 0 0  PHQ - 2 Score 0 0 0 0   Relevant past medical, surgical, family and social history reviewed Past Medical History:  Diagnosis Date  . Acute upper respiratory infection   . Allergy   . Sebaceous cyst   . Synovial cyst   . Varicose veins of lower extremity    Past Surgical History:  Procedure Laterality Date  . TONSILLECTOMY AND ADENOIDECTOMY  04/22/2007   Social History   Tobacco Use  . Smoking status: Never Smoker  . Smokeless tobacco: Never Used  Substance Use Topics  . Alcohol use: No    Alcohol/week: 0.0 oz  . Drug use: No   Interim medical history since last visit reviewed. Allergies and medications reviewed  Review of Systems Per HPI unless specifically indicated above     Objective:    BP 126/70 (BP Location: Left Arm, Patient Position: Sitting, Cuff Size: Normal)   Pulse 84   Temp 98.4 F (36.9 C) (Oral)   Ht 5' 4.88"  (1.648 m)   Wt 102 lb (46.3 kg)   SpO2 95%   BMI 17.04 kg/m   Wt Readings from Last 3 Encounters:  06/07/17 102 lb (46.3 kg)  08/22/16 106 lb 3.2 oz (48.2 kg)  09/19/15 107 lb 12.8 oz (48.9 kg)    Physical Exam  Constitutional: She appears well-developed and well-nourished. No distress.  HENT:  Right Ear: Tympanic membrane is not erythematous. No middle ear effusion.  Left Ear: Tympanic membrane is not erythematous.  No middle ear effusion.  Nose: Rhinorrhea present.  Mouth/Throat: Oropharynx is clear and moist and mucous membranes are normal.  Cardiovascular: Normal rate and regular rhythm.  Pulmonary/Chest: Effort normal and breath sounds normal. No respiratory distress. She has no wheezes. She has no rales.  Skin: She is not diaphoretic. No pallor.    Results for orders placed or performed in visit on 01/27/15  HM MAMMOGRAPHY  Result Value Ref Range   HM Mammogram Normal       Assessment & Plan:   Problem List Items Addressed This Visit    None    Visit Diagnoses    Bronchitis with bronchospasm       explained dx, antibiotics not recommended, lengthy discussion; prednisone, tessalon perles;  call if worsening, s/s of pneumonia, etc.   Relevant Medications   albuterol (PROVENTIL HFA;VENTOLIN HFA) 108 (90 Base) MCG/ACT inhaler       Follow up plan: Return if symptoms worsen or fail to improve.  An after-visit summary was printed and given to the patient at [facility].  Please see the patient instructions which may contain other information and recommendations beyond what is mentioned above in the assessment and plan.  Meds ordered this encounter  Medications  . benzonatate (TESSALON) 100 MG capsule    Sig: Take 1 capsule (100 mg total) 2 (two) times daily as needed by mouth for cough.    Dispense:  20 capsule    Refill:  0  . predniSONE (DELTASONE) 20 MG tablet    Sig: Take 1 tablet (20 mg total) daily with breakfast for 5 days by mouth.    Dispense:  10 tablet     Refill:  0  . albuterol (PROVENTIL HFA;VENTOLIN HFA) 108 (90 Base) MCG/ACT inhaler    Sig: Inhale 2 puffs every 4 (four) hours as needed into the lungs for wheezing or shortness of breath.    Dispense:  1 Inhaler    Refill:  1  . fluticasone (FLONASE) 50 MCG/ACT nasal spray    Sig: Place 2 sprays as needed into both nostrils.    Dispense:  16 g    Refill:  11    No orders of the defined types were placed in this encounter.

## 2017-06-11 ENCOUNTER — Encounter: Payer: Self-pay | Admitting: Family Medicine

## 2017-06-11 ENCOUNTER — Ambulatory Visit
Admission: RE | Admit: 2017-06-11 | Discharge: 2017-06-11 | Disposition: A | Discharge status: Home / Self Care | Payer: BLUE CROSS/BLUE SHIELD | Source: Ambulatory Visit | Attending: Family Medicine | Admitting: Family Medicine

## 2017-06-11 ENCOUNTER — Ambulatory Visit: Payer: BLUE CROSS/BLUE SHIELD | Admitting: Family Medicine

## 2017-06-11 VITALS — BP 118/64 | HR 86 | Temp 97.9°F | Ht 64.88 in | Wt 103.0 lb

## 2017-06-11 DIAGNOSIS — J42 Unspecified chronic bronchitis: Secondary | ICD-10-CM | POA: Insufficient documentation

## 2017-06-11 DIAGNOSIS — R059 Cough, unspecified: Secondary | ICD-10-CM

## 2017-06-11 DIAGNOSIS — M8580 Other specified disorders of bone density and structure, unspecified site: Secondary | ICD-10-CM

## 2017-06-11 DIAGNOSIS — J209 Acute bronchitis, unspecified: Secondary | ICD-10-CM | POA: Diagnosis not present

## 2017-06-11 DIAGNOSIS — R05 Cough: Secondary | ICD-10-CM | POA: Insufficient documentation

## 2017-06-11 DIAGNOSIS — S29011A Strain of muscle and tendon of front wall of thorax, initial encounter: Secondary | ICD-10-CM | POA: Diagnosis not present

## 2017-06-11 HISTORY — DX: Unspecified chronic bronchitis: J42

## 2017-06-11 MED ORDER — DOXYCYCLINE HYCLATE 100 MG PO TABS: 100.0000 mg | ORAL_TABLET | Freq: Two times a day (BID) | ORAL | 0 refills | Status: DC

## 2017-06-11 MED ORDER — HYDROCODONE-ACETAMINOPHEN 5-325 MG PO TABS: 1.0000 | ORAL_TABLET | Freq: Four times a day (QID) | ORAL | 0 refills | Status: DC | PRN

## 2017-06-11 MED ORDER — TRAMADOL HCL 50 MG PO TABS: 50.0000 mg | ORAL_TABLET | Freq: Four times a day (QID) | ORAL | 0 refills | Status: DC | PRN

## 2017-06-11 NOTE — Assessment & Plan Note (Addendum)
Stop prednisone; start doxycycline; use SABA; pain medicine for rib pain; discussed seeing pulmonologist; chest xray today and sputum culture; call me if needed

## 2017-06-11 NOTE — Assessment & Plan Note (Signed)
I suspect patient has more of a chronic bronchitis that regular viral bronchitis at this point; we discussed other considerations, such as CF (she has never had children, thin, white), other condition; discussed pulm referral, but will hold on this

## 2017-06-11 NOTE — Progress Notes (Signed)
BP 118/64 (BP Location: Right Arm, Patient Position: Standing, Cuff Size: Normal)   Pulse 86   Temp 97.9 F (36.6 C) (Oral)   Ht 5' 4.88" (1.648 m)   Wt 103 lb (46.7 kg)   SpO2 98%   BMI 17.20 kg/m    Subjective:    Patient ID: Kristina Christian, female    DOB: [birth year], 61 y.o.   MRN: [id number]  HPI: Kristina Christian is a 61 y.o. female  Chief Complaint  Patient presents with  . Cough    Pt states that she has a very bad cough, states that it is not getting any better is worse. Believes that she has torn cartilage     HPI Patient is here for an acute visit She says her cough is "very bad" She states the cough is getting worse She also thinks she has torn cartilage No cystic fibrosis; does believes she has chronic bronchitis; spring and fall, twice a year She would see her last doctor here and then have to come back a 2nd time once it got worse and then would finally get an antibiotic that started with the letter A or the letter D; both worked Has never seen a pulmonologist No frequent infections as a child, "almost never sick as a child"; no know Ig deficiency; no children; no known CF  She has osteopenia; used to have osteoporosis, and took fosamax for several years; they noted improvement and she had to stop the medicine after a while Last DEXA scan was 2014  Depression screen [facility] 2/9 06/11/2017 06/07/2017 08/22/2016 09/19/2015 01/28/2015  Decreased Interest 0 0 0 0 0  Down, Depressed, Hopeless 0 0 0 0 0  PHQ - 2 Score 0 0 0 0 0    Relevant past medical, surgical, family and social history reviewed Past Medical History:  Diagnosis Date  . Acute upper respiratory infection   . Allergy   . Chronic bronchitis ([place]) 06/11/2017  . Sebaceous cyst   . Synovial cyst   . Varicose veins of lower extremity    Past Surgical History:  Procedure Laterality Date  . TONSILLECTOMY AND ADENOIDECTOMY  04/22/2007   Family History  Problem Relation Age of Onset  . Von Willebrand  disease Mother    Social History   Socioeconomic History  . Marital status: Married    Spouse name: Not on file  . Number of children: Not on file  . Years of education: Not on file  . Highest education level: Not on file  Social Needs  . Financial resource strain: Not on file  . Food insecurity - worry: Not on file  . Food insecurity - inability: Not on file  . Transportation needs - medical: Not on file  . Transportation needs - non-medical: Not on file  Occupational History  . Not on file  Tobacco Use  . Smoking status: Never Smoker  . Smokeless tobacco: Never Used  Substance and Sexual Activity  . Alcohol use: No    Alcohol/week: 0.0 oz  . Drug use: No  . Sexual activity: Yes    Partners: Male    Birth control/protection: Post-menopausal  Other Topics Concern  . Not on file  Social History Narrative  . Not on file   Interim medical history since last visit reviewed. Allergies and medications reviewed  Review of Systems Per HPI unless specifically indicated above     Objective:    BP 118/64 (BP Location: Right Arm, Patient Position: Standing, Cuff Size: Normal)  Pulse 86   Temp 97.9 F (36.6 C) (Oral)   Ht 5' 4.88" (1.648 m)   Wt 103 lb (46.7 kg)   SpO2 98%   BMI 17.20 kg/m   Wt Readings from Last 3 Encounters:  06/11/17 103 lb (46.7 kg)  06/07/17 102 lb (46.3 kg)  08/22/16 106 lb 3.2 oz (48.2 kg)    Physical Exam  Constitutional: She appears well-developed and well-nourished. No distress (nontoxic, but uncomfortable and holding her lateral rib cage when coughing).  Cardiovascular: Normal rate and regular rhythm.  Pulmonary/Chest: Effort normal and breath sounds normal. No accessory muscle usage. No tachypnea. No respiratory distress. She has no wheezes. She has no rales.  Psychiatric: Her mood appears not anxious.   Results for orders placed or performed in visit on 01/27/15  HM MAMMOGRAPHY  Result Value Ref Range   HM Mammogram Normal         Assessment & Plan:   Problem List Items Addressed This Visit      Respiratory   Chronic bronchitis with acute exacerbation ([place])    Stop prednisone; start doxycycline; use SABA; pain medicine for rib pain; discussed seeing pulmonologist; chest xray today and sputum culture; call me if needed      Relevant Orders   Respiratory or Resp and Sputum Culture   Chronic bronchitis ([place])    I suspect patient has more of a chronic bronchitis that regular viral bronchitis at this point; we discussed other considerations, such as CF (she has never had children, thin, white), other condition; discussed pulm referral, but will hold on this        Musculoskeletal and Integument   Osteopenia    Used to take fosamax for several years; not on medicine currently; discussed DEXA scans      Relevant Orders   DG Bone Density    Other Visit Diagnoses    Cough    -  Primary   Relevant Orders   DG Chest 2 View   Respiratory or Resp and Sputum Culture   Intercostal muscle strain, initial encounter       stop prednisone; start ibuprofen; hydrocodone if needed (will help cough too); reviewed [place] web site, no fills x 2 years      Follow up plan: No Follow-up on file.  An after-visit summary was printed and given to the patient at [employer].  Please see the patient instructions which may contain other information and recommendations beyond what is mentioned above in the assessment and plan.  Meds ordered this encounter  Medications  . doxycycline (VIBRA-TABS) 100 MG tablet    Sig: Take 1 tablet (100 mg total) 2 (two) times daily by mouth.    Dispense:  20 tablet    Refill:  0  . DISCONTD: traMADol (ULTRAM) 50 MG tablet    Sig: Take 1 tablet (50 mg total) every 6 (six) hours as needed by mouth.    Dispense:  20 tablet    Refill:  0  . HYDROcodone-acetaminophen (NORCO/VICODIN) 5-325 MG tablet    Sig: Take 1 tablet every 6 (six) hours as needed by mouth for moderate pain.    Dispense:  20  tablet    Refill:  0    Orders Placed This Encounter  Procedures  . Respiratory or Resp and Sputum Culture  . DG Chest 2 View  . DG Bone Density    In accordance with the stop act, checked [place] web site prior to signing prescription; no fills in the  last 2 years; no red flags; acute pain, 5 days of medicine only

## 2017-06-11 NOTE — Assessment & Plan Note (Signed)
Used to take fosamax for several years; not on medicine currently; discussed DEXA scans

## 2017-06-11 NOTE — Patient Instructions (Addendum)
Stop the prednisone Start doxycycline Get a chest xray You can take 600 mg of ibuprofen every 6 to 8 hours, maximum dose is 2400 mg per day Always take ibuprofen with food to lessen the chance of a stomach ulcer Okay to use the pain medicine, which also has cough suppressant Call if needed We'll do further work-up if needed

## 2017-06-14 ENCOUNTER — Encounter: Payer: Self-pay | Admitting: Family Medicine

## 2017-06-14 LAB — RESPIRATORY CULTURE OR RESPIRATORY AND SPUTUM CULTURE
MICRO NUMBER:: 81310125
RESULT:: NORMAL
SPECIMEN QUALITY:: ADEQUATE

## 2017-09-26 ENCOUNTER — Other Ambulatory Visit: Payer: Self-pay | Admitting: Family Medicine

## 2017-09-26 DIAGNOSIS — Z1231 Encounter for screening mammogram for malignant neoplasm of breast: Secondary | ICD-10-CM

## 2017-09-27 ENCOUNTER — Ambulatory Visit: Payer: BLUE CROSS/BLUE SHIELD | Admitting: Family Medicine

## 2017-10-01 ENCOUNTER — Encounter: Payer: Self-pay | Admitting: Family Medicine

## 2017-10-01 ENCOUNTER — Ambulatory Visit
Admission: RE | Admit: 2017-10-01 | Discharge: 2017-10-01 | Disposition: A | Discharge status: Home / Self Care | Payer: BLUE CROSS/BLUE SHIELD | Source: Ambulatory Visit | Attending: Family Medicine | Admitting: Family Medicine

## 2017-10-01 ENCOUNTER — Ambulatory Visit: Payer: BLUE CROSS/BLUE SHIELD | Admitting: Family Medicine

## 2017-10-01 VITALS — BP 132/82 | HR 80 | Temp 97.6°F | Resp 14 | Wt 100.6 lb

## 2017-10-01 DIAGNOSIS — Q766 Other congenital malformations of ribs: Secondary | ICD-10-CM

## 2017-10-01 DIAGNOSIS — M898X8 Other specified disorders of bone, other site: Secondary | ICD-10-CM

## 2017-10-01 DIAGNOSIS — M8580 Other specified disorders of bone density and structure, unspecified site: Secondary | ICD-10-CM

## 2017-10-01 NOTE — Progress Notes (Signed)
BP 132/82   Pulse 80   Temp 97.6 F (36.4 C) (Oral)   Resp 14   Wt 100 lb 9.6 oz (45.6 kg)   SpO2 98%   BMI 16.80 kg/m    Subjective:    Patient ID: Kristina Christian, female    DOB: [birth year], 62 y.o.   MRN: [id number]  HPI: Kristina Christian is a 61 y.o. female  Chief Complaint  Patient presents with  . Cyst    left side upper chest onset yrs but now is feeling a constant pressure    HPI She is here for an acute visit When she gets bronchitis, she tears the cartilage She has some bulging areas where they never went back into place right; it happens There was another spot on the right side that came up and figured it was another place that came up It never hurt The last two weeks, she has just had a pressure; NOT pain, just a presence; it is on a bump or a knot She has tried circulating it, thought maybe something like lymph, so rubbing and lymph brush Nothing helping No nipple drainage Mammogram coming up Thursday She has weighed 102 since she was 62 years old; the few pounds of weight loss does not worry her  Depression screen [facility] 2/9 10/01/2017 06/11/2017 06/07/2017 08/22/2016 09/19/2015  Decreased Interest 0 0 0 0 0  Down, Depressed, Hopeless 0 0 0 0 0  PHQ - 2 Score 0 0 0 0 0   Relevant past medical, surgical, family and social history reviewed Past Medical History:  Diagnosis Date  . Acute upper respiratory infection   . Allergy   . Chronic bronchitis ([place]) 06/11/2017  . Sebaceous cyst   . Synovial cyst   . Varicose veins of lower extremity    Past Surgical History:  Procedure Laterality Date  . TONSILLECTOMY AND ADENOIDECTOMY  04/22/2007   Family History  Problem Relation Age of Onset  . Von Willebrand disease Mother    Social History   Tobacco Use  . Smoking status: Never Smoker  . Smokeless tobacco: Never Used  Substance Use Topics  . Alcohol use: No    Alcohol/week: 0.0 oz  . Drug use: No    Interim medical history since last visit  reviewed. Allergies and medications reviewed  Review of Systems Per HPI unless specifically indicated above     Objective:    BP 132/82   Pulse 80   Temp 97.6 F (36.4 C) (Oral)   Resp 14   Wt 100 lb 9.6 oz (45.6 kg)   SpO2 98%   BMI 16.80 kg/m   Wt Readings from Last 3 Encounters:  10/01/17 100 lb 9.6 oz (45.6 kg)  06/11/17 103 lb (46.7 kg)  06/07/17 102 lb (46.3 kg)    Physical Exam  Constitutional: She appears well-developed and well-nourished.  Thin underweight female; no distress  HENT:  Mouth/Throat: Mucous membranes are normal.  Eyes: EOM are normal. No scleral icterus.  Cardiovascular: Normal rate and regular rhythm.  Pulmonary/Chest: Effort normal and breath sounds normal. No respiratory distress. She has no wheezes. She has no rales. Right breast exhibits no inverted nipple, no mass, no nipple discharge, no skin change and no tenderness. Left breast exhibits no inverted nipple, no mass, no nipple discharge, no skin change and no tenderness. Breasts are symmetrical.    Hard bony knot on the anterior chest wall  Psychiatric: She has a normal mood and affect. Her behavior is normal.  Results for orders placed or performed in visit on 06/11/17  Respiratory or Resp and Sputum Culture  Result Value Ref Range   MICRO NUMBER: [id number]    SPECIMEN QUALITY: ADEQUATE    Source SPUTUM    STATUS: FINAL    GRAM STAIN: (A)     Many Polymorphonuclear leukocytes Rare epithelial cells Many Gram positive cocci in clusters Many Gram positive cocci in pairs Few Gram negative bacilli   RESULT: Growth of normal oropharyngeal flora.       Assessment & Plan:   Problem List Items Addressed This Visit      Musculoskeletal and Integument   Osteopenia    Encouraged patient to have DEXA scan when due, order is already in system       Other Visit Diagnoses    Abnormal prominence of rib    -  Primary   will start with plain film; proceed to CT scan if needed; do not suspect  this to be breast tissue, as it feels too bony; she does have a mammogram Thurs   Relevant Orders   DG Ribs Unilateral Right       Follow up plan: No Follow-up on file.  An after-visit summary was printed and given to the patient at [facility].  Please see the patient instructions which may contain other information and recommendations beyond what is mentioned above in the assessment and plan.  No orders of the defined types were placed in this encounter.   Orders Placed This Encounter  Procedures  . DG Ribs Unilateral Right

## 2017-10-01 NOTE — Assessment & Plan Note (Signed)
Encouraged patient to have DEXA scan when due, order is already in system

## 2017-10-01 NOTE — Patient Instructions (Addendum)
Please have the xray done today (across the street) We'll proceed with further work-up if needed Please have your dexa scan (bone density test) when due

## 2017-10-04 ENCOUNTER — Ambulatory Visit
Admission: RE | Admit: 2017-10-04 | Discharge: 2017-10-04 | Disposition: A | Discharge status: Home / Self Care | Payer: BLUE CROSS/BLUE SHIELD | Source: Ambulatory Visit | Attending: Family Medicine | Admitting: Family Medicine

## 2017-10-04 ENCOUNTER — Other Ambulatory Visit: Payer: Self-pay | Admitting: Family Medicine

## 2017-10-04 DIAGNOSIS — Z1231 Encounter for screening mammogram for malignant neoplasm of breast: Secondary | ICD-10-CM

## 2017-10-04 HISTORY — DX: Diffuse cystic mastopathy of unspecified breast: N60.19

## 2017-11-20 ENCOUNTER — Encounter: Payer: Self-pay | Admitting: Family Medicine

## 2018-01-06 ENCOUNTER — Encounter: Payer: Self-pay | Admitting: Family Medicine

## 2018-01-06 ENCOUNTER — Ambulatory Visit: Payer: BLUE CROSS/BLUE SHIELD | Admitting: Family Medicine

## 2018-01-06 VITALS — BP 134/78 | HR 76 | Temp 97.8°F | Resp 12 | Ht 65.0 in | Wt 100.6 lb

## 2018-01-06 DIAGNOSIS — R252 Cramp and spasm: Secondary | ICD-10-CM

## 2018-01-06 DIAGNOSIS — I8390 Asymptomatic varicose veins of unspecified lower extremity: Secondary | ICD-10-CM | POA: Diagnosis not present

## 2018-01-06 DIAGNOSIS — Z23 Encounter for immunization: Secondary | ICD-10-CM

## 2018-01-06 DIAGNOSIS — R636 Underweight: Secondary | ICD-10-CM

## 2018-01-06 DIAGNOSIS — E2839 Other primary ovarian failure: Secondary | ICD-10-CM | POA: Diagnosis not present

## 2018-01-06 DIAGNOSIS — M8580 Other specified disorders of bone density and structure, unspecified site: Secondary | ICD-10-CM | POA: Diagnosis not present

## 2018-01-06 DIAGNOSIS — Z0000 Encounter for general adult medical examination without abnormal findings: Secondary | ICD-10-CM

## 2018-01-06 NOTE — Patient Instructions (Addendum)
Please do call to schedule your bone density study; the number to schedule one at either [facility] or [facility] is [phone] or [phone] Let's get labs today Consider horse chestnut for vein health Please return before Sept 30th for your pap smear Consider 4 ounces of tonic water in the evenings to see if that help   Fall Prevention in the Home Falls can cause injuries and can affect people from all age groups. There are many simple things that you can do to make your home safe and to help prevent falls. What can I do on the outside of my home?  Regularly repair the edges of walkways and driveways and fix any cracks.  Remove high doorway thresholds.  Trim any shrubbery on the main path into your home.  Use bright outdoor lighting.  Clear walkways of debris and clutter, including tools and rocks.  Regularly check that handrails are securely fastened and in good repair. Both sides of any steps should have handrails.  Install guardrails along the edges of any raised decks or porches.  Have leaves, snow, and ice cleared regularly.  Use sand or salt on walkways during winter months.  In the garage, clean up any spills right away, including grease or oil spills. What can I do in the bathroom?  Use night lights.  Install grab bars by the toilet and in the tub and shower. Do not use towel bars as grab bars.  Use non-skid mats or decals on the floor of the tub or shower.  If you need to sit down while you are in the shower, use a plastic, non-slip stool.  Keep the floor dry. Immediately clean up any water that spills on the floor.  Remove soap buildup in the tub or shower on a regular basis.  Attach bath mats securely with double-sided non-slip rug tape.  Remove throw rugs and other tripping hazards from the floor. What can I do in the bedroom?  Use night lights.  Make sure that a bedside light is easy to reach.  Do not use  oversized bedding that drapes onto the floor.  Have a firm chair that has side arms to use for getting dressed.  Remove throw rugs and other tripping hazards from the floor. What can I do in the kitchen?  Clean up any spills right away.  Avoid walking on wet floors.  Place frequently used items in easy-to-reach places.  If you need to reach for something above you, use a sturdy step stool that has a grab bar.  Keep electrical cables out of the way.  Do not use floor polish or wax that makes floors slippery. If you have to use wax, make sure that it is non-skid floor wax.  Remove throw rugs and other tripping hazards from the floor. What can I do in the stairways?  Do not leave any items on the stairs.  Make sure that there are handrails on both sides of the stairs. Fix handrails that are broken or loose. Make sure that handrails are as long as the stairways.  Check any carpeting to make sure that it is firmly attached to the stairs. Fix any carpet that is loose or worn.  Avoid having throw rugs at the top or bottom of stairways, or secure the rugs with carpet tape to prevent them from moving.  Make sure that you have a light switch at the top of the stairs and the bottom of the stairs. If  you do not have them, have them installed. What are some other fall prevention tips?  Wear closed-toe shoes that fit well and support your feet. Wear shoes that have rubber soles or low heels.  When you use a stepladder, make sure that it is completely opened and that the sides are firmly locked. Have someone hold the ladder while you are using it. Do not climb a closed stepladder.  Add color or contrast paint or tape to grab bars and handrails in your home. Place contrasting color strips on the first and last steps.  Use mobility aids as needed, such as canes, walkers, scooters, and crutches.  Turn on lights if it is dark. Replace any light bulbs that burn out.  Set up furniture so that  there are clear paths. Keep the furniture in the same spot.  Fix any uneven floor surfaces.  Choose a carpet design that does not hide the edge of steps of a stairway.  Be aware of any and all pets.  Review your medicines with your healthcare provider. Some medicines can cause dizziness or changes in blood pressure, which increase your risk of falling. Talk with your health care provider about other ways that you can decrease your risk of falls. This may include working with a physical therapist or trainer to improve your strength, balance, and endurance. This information is not intended to replace advice given to you by your health care provider. Make sure you discuss any questions you have with your health care provider. Document Released: 06/29/2002 Document Revised: 12/06/2015 Document Reviewed: 08/13/2014 Elsevier Interactive Patient Education  [employer].

## 2018-01-06 NOTE — Assessment & Plan Note (Addendum)
Check vit D and calcium; add on phos and iPTH if needed; fall precautions discussed; see AVS; patient agrees to get DEXA done

## 2018-01-06 NOTE — Assessment & Plan Note (Signed)
Consider horse chestnut; can refer to vasc if needed; I explained that I don't think her circulation issue is arterial, but rather vein issue

## 2018-01-06 NOTE — Progress Notes (Signed)
BP 134/78   Pulse 76   Temp 97.8 F (36.6 C) (Oral)   Resp 12   Ht 5\' 5"  (1.651 m)   Wt 100 lb 9.6 oz (45.6 kg)   SpO2 96%   BMI 16.74 kg/m    Subjective:    Patient ID: Kristina Christian, female    DOB: 11/22/1955, 62 y.o.   MRN: [id number]  HPI: Kristina Christian is a 62 y.o. female  Chief Complaint  Patient presents with  . Leg Pain    cramping in hands, feet and legs for years getting worse at night    HPI Patient is here for an acute problem She has been having cramping in her hands and her feet Going on for years Worse at night Started in the feet many years ago, toe and arch would get rigid, or big toe would go off Legs are mostly affected in the calf and sometimes in the thighs No one in the family that she knows of has things No diuretics at all Good veggie eater; lots of fruits and greens; keeps her proteins up She can eat bananas and it doesn't help Never tried quinine Never tried tonic water Was told she had an irregular heartbeat, but wore a holter monitor and nothing worrisome showed up She is underweight; she is usually 102 pounds at home and her scale Osteopenia; last DEXA 2014; getting green leafy; fall precautions Bad circulation runs in her mothers side of the family; has always had bad circulation; spider veins since age 59  Depression screen PH[facility] 2/9 01/06/2018 10/01/2017 06/11/2017 06/07/2017 08/22/2016  Decreased Interest 0 0 0 0 0  Down, Depressed, Hopeless 0 0 0 0 0  PHQ - 2 Score 0 0 0 0 0    Relevant past medical, surgical, family and social history reviewed Past Medical History:  Diagnosis Date  . Acute upper respiratory infection   . Allergy   . Chronic bronchitis ([place]) 06/11/2017  . Fibrocystic breast   . Sebaceous cyst   . Synovial cyst   . Varicose veins of lower extremity    Past Surgical History:  Procedure Laterality Date  . TONSILLECTOMY AND ADENOIDECTOMY  04/22/2007   Family History  Problem Relation Age of Onset  . Von  Willebrand disease Mother    Social History   Tobacco Use  . Smoking status: Never Smoker  . Smokeless tobacco: Never Used  Substance Use Topics  . Alcohol use: No    Alcohol/week: 0.0 oz  . Drug use: No    Interim medical history since last visit reviewed. Allergies and medications reviewed  Review of Systems Per HPI unless specifically indicated above     Objective:    BP 134/78   Pulse 76   Temp 97.8 F (36.6 C) (Oral)   Resp 12   Ht 5\' 5"  (1.651 m)   Wt 100 lb 9.6 oz (45.6 kg)   SpO2 96%   BMI 16.74 kg/m   Wt Readings from Last 3 Encounters:  01/06/18 100 lb 9.6 oz (45.6 kg)  10/01/17 100 lb 9.6 oz (45.6 kg)  06/11/17 103 lb (46.7 kg)    Physical Exam  Constitutional: She appears well-developed and well-nourished.  Thin, underweight; NAD  HENT:  Mouth/Throat: Mucous membranes are normal.  Eyes: EOM are normal. No scleral icterus.  Cardiovascular: Normal rate and regular rhythm.  Pulses:      Dorsalis pedis pulses are 1+ on the right side, and 1+ on the left side.  Feet  are warm and appear well-perfused  Pulmonary/Chest: Effort normal and breath sounds normal.  Neurological: She displays no tremor.  Reflex Scores:      Patellar reflexes are 3+ on the right side and 3+ on the left side. No muscle fasciculation seen on the forearms  Skin: No pallor.  Numerous spider veins bilateral lower legs  Psychiatric: She has a normal mood and affect. Her behavior is normal. Her mood appears not anxious. She does not exhibit a depressed mood.    Results for orders placed or performed in visit on 06/11/17  Respiratory or Resp and Sputum Culture  Result Value Ref Range   MICRO NUMBER: [id number]    SPECIMEN QUALITY: ADEQUATE    Source SPUTUM    STATUS: FINAL    GRAM STAIN: (A)     Many Polymorphonuclear leukocytes Rare epithelial cells Many Gram positive cocci in clusters Many Gram positive cocci in pairs Few Gram negative bacilli   RESULT: Growth of normal  oropharyngeal flora.       Assessment & Plan:   Problem List Items Addressed This Visit      Cardiovascular and Mediastinum   Spider vein of lower extremity    Consider horse chestnut; can refer to vasc if needed; I explained that I don't think her circulation issue is arterial, but rather vein issue        Musculoskeletal and Integument   Osteopenia    Check vit D and calcium; add on phos and iPTH if needed; fall precautions discussed; see AVS; patient agrees to get DEXA done      Relevant Orders   DG Bone Density   VITAMIN D 25 Hydroxy (Vit-D Deficiency, Fractures)    Other Visit Diagnoses    Cramps of lower extremity    -  Primary   will check Mg2+, K+, Ca2+; suggested tonic water; continue work-up if nothing obvious   Relevant Orders   Magnesium   Estrogen deficiency       check DEXA   Relevant Orders   DG Bone Density   Underweight       Relevant Orders   DG Bone Density   Need for Tdap vaccination       offered, given today   Relevant Orders   Tdap vaccine greater than or equal to 7yo IM (Completed)   Preventative health care       encouraged patient to return for CPE; she'll schedule; will get labs today since we're drawing for other bloodwork   Relevant Orders   CBC with Differential/Platelet   COMPLETE METABOLIC PANEL WITH GFR   Lipid panel   TSH       Follow up plan: Return in about 8 weeks (around 03/03/2018) for complete physical.  An after-visit summary was printed and given to the patient at [facility].  Please see the patient instructions which may contain other information and recommendations beyond what is mentioned above in the assessment and plan.  No orders of the defined types were placed in this encounter.   Orders Placed This Encounter  Procedures  . DG Bone Density  . Tdap vaccine greater than or equal to 7yo IM  . CBC with Differential/Platelet  . COMPLETE METABOLIC PANEL WITH GFR  . Lipid panel  . TSH  . VITAMIN D 25 Hydroxy  (Vit-D Deficiency, Fractures)  . Magnesium

## 2018-01-07 LAB — LIPID PANEL
CHOL/HDL RATIO: 2.6 (calc) (ref <5.0)
Cholesterol: 182 mg/dL (ref <200)
HDL: 71 mg/dL (ref >50)
LDL CHOLESTEROL (CALC): 94 mg/dL
NON-HDL CHOLESTEROL (CALC): 111 mg/dL (ref <130)
Triglycerides: 77 mg/dL (ref <150)

## 2018-01-07 LAB — COMPLETE METABOLIC PANEL WITH GFR
AG Ratio: 2 (calc) (ref 1.0–2.5)
ALBUMIN MSPROF: 4.8 g/dL (ref 3.6–5.1)
ALT: 14 U/L (ref 6–29)
AST: 17 U/L (ref 10–35)
Alkaline phosphatase (APISO): 76 U/L (ref 33–130)
BILIRUBIN TOTAL: 0.6 mg/dL (ref 0.2–1.2)
BUN: 25 mg/dL (ref 7–25)
CALCIUM: 9.7 mg/dL (ref 8.6–10.4)
CO2: 29 mmol/L (ref 20–32)
CREATININE: 0.72 mg/dL (ref 0.50–0.99)
Chloride: 104 mmol/L (ref 98–110)
GFR, EST AFRICAN AMERICAN: 105 mL/min/{1.73_m2} (ref >=60)
GFR, EST NON AFRICAN AMERICAN: 90 mL/min/{1.73_m2} (ref >=60)
GLUCOSE: 86 mg/dL (ref 65–139)
Globulin: 2.4 g/dL (calc) (ref 1.9–3.7)
Potassium: 4.2 mmol/L (ref 3.5–5.3)
Sodium: 141 mmol/L (ref 135–146)
TOTAL PROTEIN: 7.2 g/dL (ref 6.1–8.1)

## 2018-01-07 LAB — VITAMIN D 25 HYDROXY (VIT D DEFICIENCY, FRACTURES): VIT D 25 HYDROXY: 43 ng/mL (ref 30–100)

## 2018-01-07 LAB — CBC WITH DIFFERENTIAL/PLATELET
BASOS ABS: 31 {cells}/uL (ref 0–200)
Basophils Relative: 0.6 %
EOS ABS: 82 {cells}/uL (ref 15–500)
EOS PCT: 1.6 %
HEMATOCRIT: 40.4 % (ref 35.0–45.0)
Hemoglobin: 13.7 g/dL (ref 11.7–15.5)
LYMPHS ABS: 1127 {cells}/uL (ref 850–3900)
MCH: 32.9 pg (ref 27.0–33.0)
MCHC: 33.9 g/dL (ref 32.0–36.0)
MCV: 96.9 fL (ref 80.0–100.0)
MPV: 10.8 fL (ref 7.5–12.5)
Monocytes Relative: 10.9 %
NEUTROS PCT: 64.8 %
Neutro Abs: 3305 cells/uL (ref 1500–7800)
Platelets: 196 10*3/uL (ref 140–400)
RBC: 4.17 10*6/uL (ref 3.80–5.10)
RDW: 11.6 % (ref 11.0–15.0)
Total Lymphocyte: 22.1 %
WBC mixed population: 556 cells/uL (ref 200–950)
WBC: 5.1 10*3/uL (ref 3.8–10.8)

## 2018-01-07 LAB — TSH: TSH: 2.1 m[IU]/L (ref 0.40–4.50)

## 2018-01-07 LAB — MAGNESIUM: Magnesium: 2.4 mg/dL (ref 1.5–2.5)

## 2018-01-14 ENCOUNTER — Encounter: Payer: Self-pay | Admitting: Family Medicine

## 2018-01-22 ENCOUNTER — Telehealth: Payer: Self-pay

## 2018-01-22 NOTE — Telephone Encounter (Signed)
Pt called back to state she received one in the mail

## 2018-01-22 NOTE — Telephone Encounter (Signed)
Copied from [place] [phone] [id number]. Topic: General - Other >> Jan 22, 2018  8:58 AM Boyd Kerbs wrote: Reason for CRM:  pt. Is calling about lab results - she had asked for a hard copy and is asking for someone to call her when she can come by and pick it up  Please call patient and let her know labs are ready for pick up.

## 2018-03-17 ENCOUNTER — Encounter: Payer: Self-pay | Admitting: Nurse Practitioner

## 2018-03-17 ENCOUNTER — Ambulatory Visit
Admission: RE | Admit: 2018-03-17 | Discharge: 2018-03-17 | Disposition: A | Discharge status: Home / Self Care | Payer: BLUE CROSS/BLUE SHIELD | Source: Ambulatory Visit | Attending: Family Medicine | Admitting: Family Medicine

## 2018-03-17 DIAGNOSIS — M8580 Other specified disorders of bone density and structure, unspecified site: Secondary | ICD-10-CM | POA: Diagnosis present

## 2018-03-17 DIAGNOSIS — R636 Underweight: Secondary | ICD-10-CM | POA: Insufficient documentation

## 2018-03-17 DIAGNOSIS — E2839 Other primary ovarian failure: Secondary | ICD-10-CM | POA: Diagnosis present

## 2018-03-19 ENCOUNTER — Encounter: Payer: Self-pay | Admitting: Nurse Practitioner

## 2018-03-19 ENCOUNTER — Other Ambulatory Visit: Payer: Self-pay | Admitting: Nurse Practitioner

## 2018-03-19 DIAGNOSIS — M810 Age-related osteoporosis without current pathological fracture: Secondary | ICD-10-CM

## 2018-04-08 ENCOUNTER — Encounter: Payer: Self-pay | Admitting: Family Medicine

## 2018-04-10 ENCOUNTER — Telehealth: Payer: Self-pay

## 2018-04-10 MED ORDER — ALENDRONATE SODIUM 70 MG PO TABS: 70.0000 mg | ORAL_TABLET | ORAL | 11 refills | Status: DC

## 2018-04-10 NOTE — Telephone Encounter (Signed)
I just saw her MyChart message I'm happy to prescribe Fosamax and forego the endo appt for now We'll want to recheck DEXA in one year instead of two years to assess response to therapy

## 2018-04-10 NOTE — Telephone Encounter (Signed)
Left detailed voicemail

## 2018-04-10 NOTE — Telephone Encounter (Signed)
Copied from [place] [phone]. Topic: Referral - Question >> Apr 10, 2018  1:43 PM Alfredia Ferguson R wrote: Pt states she is still in the works with going to endocrinologist shes trying to get Dr Sanda Klein to prescribe her fosomax

## 2018-04-10 NOTE — Addendum Note (Signed)
Addended by: LADA, Satira Anis on: 04/10/2018 02:15 PM   Modules accepted: Orders

## 2018-04-22 ENCOUNTER — Encounter: Payer: Self-pay | Admitting: Family Medicine

## 2018-04-22 ENCOUNTER — Ambulatory Visit: Payer: BLUE CROSS/BLUE SHIELD | Admitting: Family Medicine

## 2018-04-22 ENCOUNTER — Other Ambulatory Visit: Payer: Self-pay | Admitting: Family Medicine

## 2018-04-22 VITALS — BP 108/62 | HR 89 | Temp 97.9°F | Ht 64.0 in | Wt 99.6 lb

## 2018-04-22 DIAGNOSIS — Z0000 Encounter for general adult medical examination without abnormal findings: Secondary | ICD-10-CM

## 2018-04-22 DIAGNOSIS — R310 Gross hematuria: Secondary | ICD-10-CM

## 2018-04-22 DIAGNOSIS — Z124 Encounter for screening for malignant neoplasm of cervix: Secondary | ICD-10-CM | POA: Diagnosis not present

## 2018-04-22 DIAGNOSIS — Z1211 Encounter for screening for malignant neoplasm of colon: Secondary | ICD-10-CM | POA: Diagnosis not present

## 2018-04-22 NOTE — Patient Instructions (Addendum)
Let me know if you would like a short trial of gabapentin  I do recommend yearly flu shots; for individuals who don't want flu shots, try to practice excellent hand hygiene, and avoid nursing homes, day cares, and hospitals during peak flu season; taking additional vitamin C daily during flu/cold season may help boost your immune system too  Health Maintenance, Female Adopting a healthy lifestyle and getting preventive care can go a long way to promote health and wellness. Talk with your health care provider about what schedule of regular examinations is right for you. This is a good chance for you to check in with your provider about disease prevention and staying healthy. In between checkups, there are plenty of things you can do on your own. Experts have done a lot of research about which lifestyle changes and preventive measures are most likely to keep you healthy. Ask your health care provider for more information. Weight and diet Eat a healthy diet  Be sure to include plenty of vegetables, fruits, low-fat dairy products, and lean protein.  Do not eat a lot of foods high in solid fats, added sugars, or salt.  Get regular exercise. This is one of the most important things you can do for your health. ? Most adults should exercise for at least 150 minutes each week. The exercise should increase your heart rate and make you sweat (moderate-intensity exercise). ? Most adults should also do strengthening exercises at least twice a week. This is in addition to the moderate-intensity exercise.  Maintain a healthy weight  Body mass index (BMI) is a measurement that can be used to identify possible weight problems. It estimates body fat based on height and weight. Your health care provider can help determine your BMI and help you achieve or maintain a healthy weight.  For females 80 years of age and older: ? A BMI below 18.5 is considered underweight. ? A BMI of 18.5 to 24.9 is normal. ? A BMI of  25 to 29.9 is considered overweight. ? A BMI of 30 and above is considered obese.  Watch levels of cholesterol and blood lipids  You should start having your blood tested for lipids and cholesterol at 62 years of age, then have this test every 5 years.  You may need to have your cholesterol levels checked more often if: ? Your lipid or cholesterol levels are high. ? You are older than 62 years of age. ? You are at high risk for heart disease.  Cancer screening Lung Cancer  Lung cancer screening is recommended for adults 32-68 years old who are at high risk for lung cancer because of a history of smoking.  A yearly low-dose CT scan of the lungs is recommended for people who: ? Currently smoke. ? Have quit within the past 15 years. ? Have at least a 30-pack-year history of smoking. A pack year is smoking an average of one pack of cigarettes a day for 1 year.  Yearly screening should continue until it has been 15 years since you quit.  Yearly screening should stop if you develop a health problem that would prevent you from having lung cancer treatment.  Breast Cancer  Practice breast self-awareness. This means understanding how your breasts normally appear and feel.  It also means doing regular breast self-exams. Let your health care provider know about any changes, no matter how small.  If you are in your 20s or 30s, you should have a clinical breast exam (CBE) by a  health care provider every 1-3 years as part of a regular health exam.  If you are 46 or older, have a CBE every year. Also consider having a breast X-ray (mammogram) every year.  If you have a family history of breast cancer, talk to your health care provider about genetic screening.  If you are at high risk for breast cancer, talk to your health care provider about having an MRI and a mammogram every year.  Breast cancer gene (BRCA) assessment is recommended for women who have family members with BRCA-related  cancers. BRCA-related cancers include: ? Breast. ? Ovarian. ? Tubal. ? Peritoneal cancers.  Results of the assessment will determine the need for genetic counseling and BRCA1 and BRCA2 testing.  Cervical Cancer Your health care provider may recommend that you be screened regularly for cancer of the pelvic organs (ovaries, uterus, and vagina). This screening involves a pelvic examination, including checking for microscopic changes to the surface of your cervix (Pap test). You may be encouraged to have this screening done every 3 years, beginning at age 26.  For women ages 24-65, health care providers may recommend pelvic exams and Pap testing every 3 years, or they may recommend the Pap and pelvic exam, combined with testing for human papilloma virus (HPV), every 5 years. Some types of HPV increase your risk of cervical cancer. Testing for HPV may also be done on women of any age with unclear Pap test results.  Other health care providers may not recommend any screening for nonpregnant women who are considered low risk for pelvic cancer and who do not have symptoms. Ask your health care provider if a screening pelvic exam is right for you.  If you have had past treatment for cervical cancer or a condition that could lead to cancer, you need Pap tests and screening for cancer for at least 20 years after your treatment. If Pap tests have been discontinued, your risk factors (such as having a new sexual partner) need to be reassessed to determine if screening should resume. Some women have medical problems that increase the chance of getting cervical cancer. In these cases, your health care provider may recommend more frequent screening and Pap tests.  Colorectal Cancer  This type of cancer can be detected and often prevented.  Routine colorectal cancer screening usually begins at 62 years of age and continues through 62 years of age.  Your health care provider may recommend screening at an  earlier age if you have risk factors for colon cancer.  Your health care provider may also recommend using home test kits to check for hidden blood in the stool.  A small camera at the end of a tube can be used to examine your colon directly (sigmoidoscopy or colonoscopy). This is done to check for the earliest forms of colorectal cancer.  Routine screening usually begins at age 89.  Direct examination of the colon should be repeated every 5-10 years through 62 years of age. However, you may need to be screened more often if early forms of precancerous polyps or small growths are found.  Skin Cancer  Check your skin from head to toe regularly.  Tell your health care provider about any new moles or changes in moles, especially if there is a change in a mole's shape or color.  Also tell your health care provider if you have a mole that is larger than the size of a pencil eraser.  Always use sunscreen. Apply sunscreen liberally and repeatedly throughout  the day.  Protect yourself by wearing long sleeves, pants, a wide-brimmed hat, and sunglasses whenever you are outside.  Heart disease, diabetes, and high blood pressure  High blood pressure causes heart disease and increases the risk of stroke. High blood pressure is more likely to develop in: ? People who have blood pressure in the high end of the normal range (130-139/85-89 mm Hg). ? People who are overweight or obese. ? People who are African American.  If you are 70-1 years of age, have your blood pressure checked every 3-5 years. If you are 28 years of age or older, have your blood pressure checked every year. You should have your blood pressure measured twice-once when you are at a hospital or clinic, and once when you are not at a hospital or clinic. Record the average of the two measurements. To check your blood pressure when you are not at a hospital or clinic, you can use: ? An automated blood pressure machine at a  pharmacy. ? A home blood pressure monitor.  If you are between 44 years and 40 years old, ask your health care provider if you should take aspirin to prevent strokes.  Have regular diabetes screenings. This involves taking a blood sample to check your fasting blood sugar level. ? If you are at a normal weight and have a low risk for diabetes, have this test once every three years after 62 years of age. ? If you are overweight and have a high risk for diabetes, consider being tested at a younger age or more often. Preventing infection Hepatitis B  If you have a higher risk for hepatitis B, you should be screened for this virus. You are considered at high risk for hepatitis B if: ? You were born in a country where hepatitis B is common. Ask your health care provider which countries are considered high risk. ? Your parents were born in a high-risk country, and you have not been immunized against hepatitis B (hepatitis B vaccine). ? You have HIV or AIDS. ? You use needles to inject street drugs. ? You live with someone who has hepatitis B. ? You have had sex with someone who has hepatitis B. ? You get hemodialysis treatment. ? You take certain medicines for conditions, including cancer, organ transplantation, and autoimmune conditions.  Hepatitis C  Blood testing is recommended for: ? Everyone born from [birth year] through 1965. ? Anyone with known risk factors for hepatitis C.  Sexually transmitted infections (STIs)  You should be screened for sexually transmitted infections (STIs) including gonorrhea and chlamydia if: ? You are sexually active and are younger than 62 years of age. ? You are older than 62 years of age and your health care provider tells you that you are at risk for this type of infection. ? Your sexual activity has changed since you were last screened and you are at an increased risk for chlamydia or gonorrhea. Ask your health care provider if you are at risk.  If you do not  have HIV, but are at risk, it may be recommended that you take a prescription medicine daily to prevent HIV infection. This is called pre-exposure prophylaxis (PrEP). You are considered at risk if: ? You are sexually active and do not regularly use condoms or know the HIV status of your partner(s). ? You take drugs by injection. ? You are sexually active with a partner who has HIV.  Talk with your health care provider about whether you are at  high risk of being infected with HIV. If you choose to begin PrEP, you should first be tested for HIV. You should then be tested every 3 months for as long as you are taking PrEP. Pregnancy  If you are premenopausal and you may become pregnant, ask your health care provider about preconception counseling.  If you may become pregnant, take 400 to 800 micrograms (mcg) of folic acid every day.  If you want to prevent pregnancy, talk to your health care provider about birth control (contraception). Osteoporosis and menopause  Osteoporosis is a disease in which the bones lose minerals and strength with aging. This can result in serious bone fractures. Your risk for osteoporosis can be identified using a bone density scan.  If you are 60 years of age or older, or if you are at risk for osteoporosis and fractures, ask your health care provider if you should be screened.  Ask your health care provider whether you should take a calcium or vitamin D supplement to lower your risk for osteoporosis.  Menopause may have certain physical symptoms and risks.  Hormone replacement therapy may reduce some of these symptoms and risks. Talk to your health care provider about whether hormone replacement therapy is right for you. Follow these instructions at home:  Schedule regular health, dental, and eye exams.  Stay current with your immunizations.  Do not use any tobacco products including cigarettes, chewing tobacco, or electronic cigarettes.  If you are pregnant,  do not drink alcohol.  If you are breastfeeding, limit how much and how often you drink alcohol.  Limit alcohol intake to no more than 1 drink per day for nonpregnant women. One drink equals 12 ounces of beer, 5 ounces of wine, or 1 ounces of hard liquor.  Do not use street drugs.  Do not share needles.  Ask your health care provider for help if you need support or information about quitting drugs.  Tell your health care provider if you often feel depressed.  Tell your health care provider if you have ever been abused or do not feel safe at home. This information is not intended to replace advice given to you by your health care provider. Make sure you discuss any questions you have with your health care provider. Document Released: 01/22/2011 Document Revised: 12/15/2015 Document Reviewed: 04/12/2015 Elsevier Interactive Patient Education  [employer].

## 2018-04-22 NOTE — Progress Notes (Signed)
BP 108/62   Pulse 89   Temp 97.9 F (36.6 C)   Ht 5' 4"  (1.626 m)   Wt 99 lb 9.6 oz (45.2 kg)   SpO2 99%   BMI 17.10 kg/m    Subjective:    Patient ID: Kristina Christian, female    DOB: April 22, 1956, 62 y.o.   MRN: [id number]  HPI: Kristina Christian is a 62 y.o. female  Chief Complaint  Patient presents with  . Annual Exam    HPI Here for her physical Not many cramps for weeks; tonic water helped the first night; taking magnesium   USPSTF grade A and B recommendations Depression:  Depression screen [facility] 2/9 04/22/2018 01/06/2018 10/01/2017 06/11/2017 06/07/2017  Decreased Interest 0 0 0 0 0  Down, Depressed, Hopeless 0 0 0 0 0  PHQ - 2 Score 0 0 0 0 0  Altered sleeping 0 - - - -  Tired, decreased energy 0 - - - -  Change in appetite 0 - - - -  Feeling bad or failure about yourself  0 - - - -  Trouble concentrating 0 - - - -  Moving slowly or fidgety/restless 0 - - - -  Suicidal thoughts 0 - - - -  PHQ-9 Score 0 - - - -   Hypertension: BP Readings from Last 3 Encounters:  04/22/18 108/62  01/06/18 134/78  10/01/17 132/82   Obesity: Wt Readings from Last 3 Encounters:  04/22/18 99 lb 9.6 oz (45.2 kg)  01/06/18 100 lb 9.6 oz (45.6 kg)  10/01/17 100 lb 9.6 oz (45.6 kg)   BMI Readings from Last 3 Encounters:  04/22/18 17.10 kg/m  01/06/18 16.74 kg/m  10/01/17 16.80 kg/m     Skin cancer: gets checked out by derm Lung cancer:  Never smoker Breast cancer: just had mammogram in March 2019 Colorectal cancer: agrees with referral Cervical cancer screening: today; no abnormals in the past BRCA gene screening: family hx of breast and/or ovarian cancer and/or metastatic prostate cancer? no HIV, hep B, hep C: no STD testing and prevention (chl/gon/syphilis): no Intimate partner violence: no abuse Contraception: n/a Osteoporosis: yes, just had DEXA; back on fosamax, just one week Fall prevention/vitamin D: discussed, taking 1,000 iu vit D3 daily Immunizations: does not  want flu shots; tetanus UTD Diet: good eater Exercise: lots Alcohol:    [facility] Visit from 04/22/2018 in [facility]  AUDIT-C Score  0     Tobacco use: never AAA: n/a Aspirin: The 10-year ASCVD risk score Mikey Bussing DC Jr., et al., 2013) is: 2.4%   Values used to calculate the score:     Age: 45 years     Sex: Female     Is Non-Hispanic African American: No     Diabetic: No     Tobacco smoker: No     Systolic Blood Pressure: 397 mmHg     Is BP treated: No     HDL Cholesterol: 71 mg/dL     Total Cholesterol: 182 mg/dL  Glucose:  Glucose, Bld  Date Value Ref Range Status  01/06/2018 86 65 - 139 mg/dL Final    Comment:    .        Non-fasting reference interval .    Lipids:  Lab Results  Component Value Date   CHOL 182 01/06/2018   Lab Results  Component Value Date   HDL 71 01/06/2018   Lab Results  Component Value Date   LDLCALC 94 01/06/2018   Lab  Results  Component Value Date   TRIG 77 01/06/2018   Lab Results  Component Value Date   CHOLHDL 2.6 01/06/2018   No results found for: LDLDIRECT   Depression screen [facility] 2/9 04/22/2018 01/06/2018 10/01/2017 06/11/2017 06/07/2017  Decreased Interest 0 0 0 0 0  Down, Depressed, Hopeless 0 0 0 0 0  PHQ - 2 Score 0 0 0 0 0  Altered sleeping 0 - - - -  Tired, decreased energy 0 - - - -  Change in appetite 0 - - - -  Feeling bad or failure about yourself  0 - - - -  Trouble concentrating 0 - - - -  Moving slowly or fidgety/restless 0 - - - -  Suicidal thoughts 0 - - - -  PHQ-9 Score 0 - - - -   Fall Risk  04/22/2018 01/06/2018 10/01/2017 06/11/2017 06/07/2017  Falls in the past year? No No No No No    Relevant past medical, surgical, family and social history reviewed Past Medical History:  Diagnosis Date  . Acute upper respiratory infection   . Allergy   . Chronic bronchitis ([place]) 06/11/2017  . Fibrocystic breast   . Osteoporosis   . Sebaceous cyst   . Synovial cyst   . Varicose veins of  lower extremity    Past Surgical History:  Procedure Laterality Date  . TONSILLECTOMY AND ADENOIDECTOMY  04/22/2007   Family History  Problem Relation Age of Onset  . Von Willebrand disease Mother   . Prostate cancer Brother   . Bladder Cancer Maternal Grandfather    Social History   Tobacco Use  . Smoking status: Never Smoker  . Smokeless tobacco: Never Used  Substance Use Topics  . Alcohol use: No    Alcohol/week: 0.0 standard drinks  . Drug use: No     Office Visit from 04/22/2018 in [facility]  AUDIT-C Score  0      Interim medical history since last visit reviewed. Allergies and medications reviewed  Review of Systems  Constitutional: Positive for unexpected weight change (used to be 102 pounds, but now 99 pounds, but not rapid).  Respiratory: Negative for wheezing.   Cardiovascular: Negative for chest pain.  Gastrointestinal: Negative for blood in stool.  Genitourinary: Positive for hematuria (pinkish in color just once in a while, maybe tomatoes).   Per HPI unless specifically indicated above     Objective:    BP 108/62   Pulse 89   Temp 97.9 F (36.6 C)   Ht 5' 4"  (1.626 m)   Wt 99 lb 9.6 oz (45.2 kg)   SpO2 99%   BMI 17.10 kg/m   Wt Readings from Last 3 Encounters:  04/22/18 99 lb 9.6 oz (45.2 kg)  01/06/18 100 lb 9.6 oz (45.6 kg)  10/01/17 100 lb 9.6 oz (45.6 kg)    Physical Exam  Constitutional: She appears well-developed and well-nourished.  HENT:  Head: Normocephalic and atraumatic.  Eyes: Conjunctivae and EOM are normal. Right eye exhibits no hordeolum. Left eye exhibits no hordeolum. No scleral icterus.  Neck: Carotid bruit is not present. No thyromegaly present.  Cardiovascular: Normal rate, regular rhythm, S1 normal, S2 normal and normal heart sounds.  No extrasystoles are present.  Pulmonary/Chest: Effort normal and breath sounds normal. No respiratory distress. Right breast exhibits no inverted nipple, no mass, no  nipple discharge, no skin change and no tenderness. Left breast exhibits no inverted nipple, no mass, no nipple discharge, no skin change  and no tenderness. Breasts are symmetrical.  Abdominal: Soft. Normal appearance and bowel sounds are normal. She exhibits no distension, no abdominal bruit, no pulsatile midline mass and no mass. There is no hepatosplenomegaly. There is no tenderness. No hernia.  Genitourinary: Uterus normal. Pelvic exam was performed with patient prone. There is no rash or lesion on the right labia. There is no rash or lesion on the left labia. Cervix exhibits no motion tenderness. Right adnexum displays no mass, no tenderness and no fullness. Left adnexum displays no mass, no tenderness and no fullness.  Genitourinary Comments: Very narrow introitus; could not insert speculum; pap smear obtained by feel using spatula and brush  Musculoskeletal: Normal range of motion. She exhibits no edema.  Lymphadenopathy:       Head (right side): No submandibular adenopathy present.       Head (left side): No submandibular adenopathy present.    She has no cervical adenopathy.    She has no axillary adenopathy.  Neurological: She is alert. She displays no tremor. No cranial nerve deficit. She exhibits normal muscle tone. Gait normal.  Skin: Skin is warm and dry. No bruising and no ecchymosis noted. No cyanosis. No pallor.  Psychiatric: Her speech is normal and behavior is normal. Thought content normal. Her mood appears not anxious. She does not exhibit a depressed mood.       Assessment & Plan:   Problem List Items Addressed This Visit      Other   Preventative health care - Primary    USPSTF grade A and B recommendations reviewed with patient; age-appropriate recommendations, preventive care, screening tests, etc discussed and encouraged; healthy living encouraged; see AVS for patient education given to patient        Other Visit Diagnoses    Screen for colon cancer        Relevant Orders   Ambulatory referral to Gastroenterology   Screening for cervical cancer       Relevant Orders   Cytology - PAP   Gross hematuria       Relevant Orders   Urinalysis w microscopic + reflex cultur (Completed)       Follow up plan: Return in about 1 year (around 04/23/2019) for complete physical.  An after-visit summary was printed and given to the patient at [place].  Please see the patient instructions which may contain other information and recommendations beyond what is mentioned above in the assessment and plan.  No orders of the defined types were placed in this encounter.   Orders Placed This Encounter  Procedures  . Urinalysis w microscopic + reflex cultur  . REFLEXIVE URINE CULTURE  . Ambulatory referral to Gastroenterology

## 2018-04-23 LAB — URINALYSIS W MICROSCOPIC + REFLEX CULTURE
BILIRUBIN URINE: NEGATIVE
Bacteria, UA: NONE SEEN /HPF
GLUCOSE, UA: NEGATIVE
Hgb urine dipstick: NEGATIVE
Hyaline Cast: NONE SEEN /LPF
KETONES UR: NEGATIVE
LEUKOCYTE ESTERASE: NEGATIVE
NITRITES URINE, INITIAL: NEGATIVE
PH: 7 (ref 5.0–8.0)
Protein, ur: NEGATIVE
SPECIFIC GRAVITY, URINE: 1.023 (ref 1.001–1.03)
SQUAMOUS EPITHELIAL / LPF: NONE SEEN /HPF (ref <=5)

## 2018-04-23 LAB — NO CULTURE INDICATED

## 2018-04-28 ENCOUNTER — Telehealth: Payer: Self-pay | Admitting: Family Medicine

## 2018-04-28 NOTE — Telephone Encounter (Signed)
Spoke with patient and she stated that she does not need the appt therefore appt has been cancelled.

## 2018-04-28 NOTE — Telephone Encounter (Signed)
Copied from [place] [phone]. Topic: General - Other >> Apr 28, 2018 11:36 AM Cecelia Byars, NT wrote: Reason for CRM: Patient says  she was in to see Dr Sanda Klein on 04/22/18 and thinks this may have been a cpe  due to having a pap also  , and would like to know if this was  one ,and does she need to come in on 05/01/18 please call her at [phone] ----------------------------------------------------- Her visit last week was a complete physical with pap smear If she does not need to come in on October 10th, okay to cancel appointment Dr. Sanda Klein

## 2018-04-29 LAB — PAP LB AND HPV HIGH-RISK
HPV, HIGH-RISK: NEGATIVE
PAP Smear Comment: 0

## 2018-04-30 ENCOUNTER — Encounter: Payer: Self-pay | Admitting: *Deleted

## 2018-05-01 ENCOUNTER — Encounter: Payer: Self-pay | Admitting: Family Medicine

## 2018-05-05 DIAGNOSIS — Z0000 Encounter for general adult medical examination without abnormal findings: Secondary | ICD-10-CM | POA: Insufficient documentation

## 2018-05-05 NOTE — Assessment & Plan Note (Signed)
USPSTF grade A and B recommendations reviewed with patient; age-appropriate recommendations, preventive care, screening tests, etc discussed and encouraged; healthy living encouraged; see AVS for patient education given to patient  

## 2018-05-14 ENCOUNTER — Other Ambulatory Visit: Payer: Self-pay

## 2018-05-14 DIAGNOSIS — Z1211 Encounter for screening for malignant neoplasm of colon: Secondary | ICD-10-CM

## 2018-05-14 MED ORDER — NA SULFATE-K SULFATE-MG SULF 17.5-3.13-1.6 GM/177ML PO SOLN: 1.0000 | Freq: Once | ORAL | 0 refills | Status: AC

## 2018-05-15 ENCOUNTER — Other Ambulatory Visit: Payer: Self-pay

## 2018-05-15 MED ORDER — PEG 3350-KCL-NABCB-NACL-NASULF 236 G PO SOLR: 4000.0000 mL | Freq: Once | ORAL | 0 refills | Status: AC

## 2018-05-20 ENCOUNTER — Encounter
Admission: RE | Disposition: A | Discharge status: Home / Self Care | Payer: Self-pay | Source: Ambulatory Visit | Attending: Gastroenterology

## 2018-05-20 ENCOUNTER — Ambulatory Visit: Payer: BLUE CROSS/BLUE SHIELD | Admitting: Certified Registered Nurse Anesthetist

## 2018-05-20 ENCOUNTER — Ambulatory Visit
Admission: RE | Admit: 2018-05-20 | Discharge: 2018-05-20 | Disposition: A | Discharge status: Home / Self Care | Payer: BLUE CROSS/BLUE SHIELD | Source: Ambulatory Visit | Attending: Gastroenterology | Admitting: Gastroenterology

## 2018-05-20 DIAGNOSIS — K219 Gastro-esophageal reflux disease without esophagitis: Secondary | ICD-10-CM | POA: Insufficient documentation

## 2018-05-20 DIAGNOSIS — D123 Benign neoplasm of transverse colon: Secondary | ICD-10-CM

## 2018-05-20 DIAGNOSIS — K5730 Diverticulosis of large intestine without perforation or abscess without bleeding: Secondary | ICD-10-CM | POA: Insufficient documentation

## 2018-05-20 DIAGNOSIS — Z1211 Encounter for screening for malignant neoplasm of colon: Secondary | ICD-10-CM | POA: Diagnosis present

## 2018-05-20 DIAGNOSIS — Z79899 Other long term (current) drug therapy: Secondary | ICD-10-CM | POA: Insufficient documentation

## 2018-05-20 DIAGNOSIS — D122 Benign neoplasm of ascending colon: Secondary | ICD-10-CM

## 2018-05-20 HISTORY — PX: COLONOSCOPY WITH PROPOFOL: SHX5780

## 2018-05-20 SURGERY — COLONOSCOPY WITH PROPOFOL
Anesthesia: General

## 2018-05-20 MED ORDER — PROPOFOL 500 MG/50ML IV EMUL
INTRAVENOUS | Status: AC
  Filled 2018-05-20: qty 50

## 2018-05-20 MED ORDER — PHENYLEPHRINE HCL 10 MG/ML IJ SOLN
INTRAMUSCULAR | Status: DC | PRN
  Administered 2018-05-20: 100 ug via INTRAVENOUS

## 2018-05-20 MED ORDER — LIDOCAINE HCL (CARDIAC) PF 100 MG/5ML IV SOSY
PREFILLED_SYRINGE | INTRAVENOUS | Status: DC | PRN
  Administered 2018-05-20: 25 mg via INTRAVENOUS

## 2018-05-20 MED ORDER — EPHEDRINE SULFATE 50 MG/ML IJ SOLN
INTRAMUSCULAR | Status: DC | PRN
  Administered 2018-05-20: 10 mg via INTRAVENOUS

## 2018-05-20 MED ORDER — PROPOFOL 500 MG/50ML IV EMUL
INTRAVENOUS | Status: DC | PRN
  Administered 2018-05-20: 150 ug/kg/min via INTRAVENOUS

## 2018-05-20 MED ORDER — SODIUM CHLORIDE 0.9 % IV SOLN
INTRAVENOUS | Status: DC
  Administered 2018-05-20: 1000 mL via INTRAVENOUS

## 2018-05-20 MED ORDER — PROPOFOL 10 MG/ML IV BOLUS
INTRAVENOUS | Status: DC | PRN
  Administered 2018-05-20 (×2): 20 mg via INTRAVENOUS
  Administered 2018-05-20: 30 mg via INTRAVENOUS

## 2018-05-20 NOTE — Anesthesia Procedure Notes (Signed)
Procedure Name: MAC Date/Time: 05/20/2018 8:30 AM Performed by: Rudean Hitt, CRNA Pre-anesthesia Checklist: Patient identified, Emergency Drugs available, Suction available, Patient being monitored and Timeout performed Patient Re-evaluated:Patient Re-evaluated prior to induction Oxygen Delivery Method: Nasal cannula Induction Type: IV induction

## 2018-05-20 NOTE — Anesthesia Preprocedure Evaluation (Signed)
Anesthesia Evaluation  Patient identified by MRN, date of birth, ID band Patient awake    Reviewed: Allergy & Precautions, H&P , NPO status , Patient's Chart, lab work & pertinent test results  History of Anesthesia Complications Negative for: history of anesthetic complications  Airway Mallampati: III  TM Distance: <3 FB Neck ROM: full    Dental  (+) Chipped   Pulmonary neg shortness of breath, COPD,           Cardiovascular Exercise Tolerance: Good (-) Past MI + dysrhythmias      Neuro/Psych negative neurological ROS  negative psych ROS   GI/Hepatic negative GI ROS, Neg liver ROS, neg GERD  ,  Endo/Other  negative endocrine ROS  Renal/GU negative Renal ROS  negative genitourinary   Musculoskeletal   Abdominal   Peds  Hematology negative hematology ROS (+)   Anesthesia Other Findings Past Medical History: No date: Acute upper respiratory infection No date: Allergy 06/11/2017: Chronic bronchitis (HCC) No date: Fibrocystic breast No date: Osteoporosis No date: Sebaceous cyst No date: Synovial cyst No date: Varicose veins of lower extremity  Past Surgical History: 04/22/2007: TONSILLECTOMY AND ADENOIDECTOMY  BMI    Body Mass Index:  17.51 kg/m      Reproductive/Obstetrics negative OB ROS                             Anesthesia Physical Anesthesia Plan  ASA: III  Anesthesia Plan: General   Post-op Pain Management:    Induction: Intravenous  PONV Risk Score and Plan: Propofol infusion and TIVA  Airway Management Planned: Natural Airway and Nasal Cannula  Additional Equipment:   Intra-op Plan:   Post-operative Plan:   Informed Consent: I have reviewed the patients History and Physical, chart, labs and discussed the procedure including the risks, benefits and alternatives for the proposed anesthesia with the patient or authorized representative who has indicated  his/her understanding and acceptance.   Dental Advisory Given  Plan Discussed with: Anesthesiologist, CRNA and Surgeon  Anesthesia Plan Comments: (Patient consented for risks of anesthesia including but not limited to:  - adverse reactions to medications - risk of intubation if required - damage to teeth, lips or other oral mucosa - sore throat or hoarseness - Damage to heart, brain, lungs or loss of life  Patient voiced understanding.)        Anesthesia Quick Evaluation

## 2018-05-20 NOTE — Anesthesia Post-op Follow-up Note (Signed)
Anesthesia QCDR form completed.        

## 2018-05-20 NOTE — Anesthesia Postprocedure Evaluation (Signed)
Anesthesia Post Note  Patient: Kristina Christian  Procedure(s) Performed: COLONOSCOPY WITH PROPOFOL (N/A )  Patient location during evaluation: Endoscopy Anesthesia Type: General Level of consciousness: awake and alert Pain management: pain level controlled Vital Signs Assessment: post-procedure vital signs reviewed and stable Respiratory status: spontaneous breathing, nonlabored ventilation, respiratory function stable and patient connected to nasal cannula oxygen Cardiovascular status: blood pressure returned to baseline and stable Postop Assessment: no apparent nausea or vomiting Anesthetic complications: no     Last Vitals:  Vitals:   05/20/18 0900 05/20/18 0910  BP: 114/66 (!) 94/58  Pulse: 64 68  Resp: 10 17  Temp: (!) 35.6 C   SpO2: 100% 100%    Last Pain:  Vitals:   05/20/18 0900  TempSrc: Tympanic  PainSc:                  Precious Haws Cordarius Benning

## 2018-05-20 NOTE — Transfer of Care (Signed)
Immediate Anesthesia Transfer of Care Note  Patient: Kristina Christian  Procedure(s) Performed: COLONOSCOPY WITH PROPOFOL (N/A )  Patient Location: PACU  Anesthesia Type:General  Level of Consciousness: drowsy  Airway & Oxygen Therapy: Patient Spontanous Breathing and Patient connected to nasal cannula oxygen  Post-op Assessment: Report given to RN and Post -op Vital signs reviewed and stable  Post vital signs: Reviewed and stable  Last Vitals:  Vitals Value Taken Time  BP 114/66 05/20/2018  9:00 AM  Temp 35.6 C 05/20/2018  9:00 AM  Pulse 61 05/20/2018  9:05 AM  Resp 10 05/20/2018  9:00 AM  SpO2 100 % 05/20/2018  9:05 AM  Vitals shown include unvalidated device data.  Last Pain:  Vitals:   05/20/18 0900  TempSrc: Tympanic  PainSc:          Complications: No apparent anesthesia complications

## 2018-05-20 NOTE — Op Note (Signed)
[facility] Gastroenterology Patient Name: Kristina Christian Procedure Date: 05/20/2018 8:17 AM MRN: [id number] Account #: [id number] Date of Birth: 01/20/1956 Admit Type: Outpatient Age: 62 Room: [facility] ENDO ROOM 1 Gender: Female Note Status: Finalized Procedure:            Colonoscopy Indications:          Screening for colorectal malignant neoplasm Providers:            Jonathon Bellows MD, MD Referring MD:         Arnetha Courser (Referring MD) Medicines:            Monitored Anesthesia Care Complications:        No immediate complications. Procedure:            Pre-Anesthesia Assessment:                       - Prior to the procedure, a History and Physical was                        performed, and patient medications, allergies and                        sensitivities were reviewed. The patient's tolerance of                        previous anesthesia was reviewed.                       - The risks and benefits of the procedure and the                        sedation options and risks were discussed with the                        patient. All questions were answered and informed                        consent was obtained.                       - ASA Grade Assessment: II - A patient with mild                        systemic disease.                       After obtaining informed consent, the colonoscope was                        passed under direct vision. Throughout the procedure,                        the patient's blood pressure, pulse, and oxygen                        saturations were monitored continuously. The                        Colonoscope was introduced through the anus and  advanced to the the cecum, identified by the                        appendiceal orifice, IC valve and transillumination.                        The colonoscopy was performed with ease. The patient                        tolerated the procedure well. The  quality of the bowel                        preparation was good. Findings:      The perianal and digital rectal examinations were normal.      Two sessile polyps were found in the transverse colon and ascending       colon. The polyps were 5 to 6 mm in size. These polyps were removed with       a cold snare. Resection and retrieval were complete.      A 3 mm polyp was found in the ascending colon. The polyp was sessile.       The polyp was removed with a cold biopsy forceps. Resection and       retrieval were complete.      Multiple small-mouthed diverticula were found in the entire colon.      The exam was otherwise without abnormality on direct and retroflexion       views. Impression:           - Two 5 to 6 mm polyps in the transverse colon and in                        the ascending colon, removed with a cold snare.                        Resected and retrieved.                       - One 3 mm polyp in the ascending colon, removed with a                        cold biopsy forceps. Resected and retrieved.                       - Diverticulosis in the entire examined colon.                       - The examination was otherwise normal on direct and                        retroflexion views. Recommendation:       - Discharge patient to home (with escort).                       - Resume previous diet.                       - Continue present medications.                       - Await pathology results.                       -  Repeat colonoscopy in 3 - 5 years for surveillance                        based on pathology results. Procedure Code(s):    --- Professional ---                       [phone], Colonoscopy, flexible; with removal of tumor(s),                        polyp(s), or other lesion(s) by snare technique                       [id number], 48, Colonoscopy, flexible; with biopsy, single                        or multiple Diagnosis Code(s):    --- Professional ---                        Z12.11, Encounter for screening for malignant neoplasm                        of colon                       D12.3, Benign neoplasm of transverse colon (hepatic                        flexure or splenic flexure)                       D12.2, Benign neoplasm of ascending colon                       K57.30, Diverticulosis of large intestine without                        perforation or abscess without bleeding CPT copyright 2018 American Medical Association. All rights reserved. The codes documented in this report are preliminary and upon coder review may  be revised to meet current compliance requirements. Jonathon Bellows, MD Jonathon Bellows MD, MD 05/20/2018 9:00:43 AM This report has been signed electronically. Number of Addenda: 0 Note Initiated On: 05/20/2018 8:17 AM Scope Withdrawal Time: 0 hours 19 minutes 0 seconds  Total Procedure Duration: 0 hours 26 minutes 23 seconds       [facility]

## 2018-05-20 NOTE — H&P (Signed)
Jonathon Bellows, MD [address] [place], [place], [place], [address] [address], [place], [place], [place], [address] Phone: [phone]  Fax: [phone]  Primary Care Physician:  Arnetha Courser, MD   Pre-Procedure History & Physical: HPI:  Kristina Christian is a 62 y.o. female is here for an colonoscopy.   Past Medical History:  Diagnosis Date  . Acute upper respiratory infection   . Allergy   . Chronic bronchitis ([place]) 06/11/2017  . Fibrocystic breast   . Osteoporosis   . Sebaceous cyst   . Synovial cyst   . Varicose veins of lower extremity     Past Surgical History:  Procedure Laterality Date  . TONSILLECTOMY AND ADENOIDECTOMY  04/22/2007    Prior to Admission medications   Medication Sig Start Date End Date Taking? Authorizing Provider  alendronate (FOSAMAX) 70 MG tablet Take 1 tablet (70 mg total) by mouth every 7 (seven) days. Take with a full glass of water on an empty stomach. 04/10/18  Yes Lada, Satira Anis, MD  b complex vitamins capsule Take 1 capsule by mouth daily.   Yes [provider]  BIOFLAVONOID PRODUCTS ER PO Take by mouth.   Yes [provider]  calcium carbonate (OS-CAL) 600 MG TABS tablet Take by mouth.   Yes [provider]  cholecalciferol (VITAMIN D) 1000 units tablet Take 1,000 Units by mouth daily.   Yes [provider]  fluticasone (FLONASE) 50 MCG/ACT nasal spray Place 2 sprays as needed into both nostrils. 06/07/17  Yes Lada, Satira Anis, MD  levocetirizine (XYZAL ALLERGY 24HR) 5 MG tablet Take 5 mg by mouth as needed for allergies.   Yes [provider]  Magnesium Citrate 100 MG TABS Take 1 tablet by mouth daily.   Yes [provider]  MULTIPLE VITAMINS PO Take daily by mouth.    Yes [provider]  vitamin C (ASCORBIC ACID) 500 MG tablet Take 500 mg by mouth daily.   Yes [provider]  zinc gluconate 50 MG tablet Take 50 mg by mouth daily.   Yes  [provider]  albuterol (PROVENTIL HFA;VENTOLIN HFA) 108 (90 Base) MCG/ACT inhaler Inhale 2 puffs every 4 (four) hours as needed into the lungs for wheezing or shortness of breath. 06/07/17   Arnetha Courser, MD    Allergies as of 05/14/2018 - Review Complete 04/22/2018  Allergen Reaction Noted  . Breo ellipta [fluticasone furoate-vilanterol]  06/17/2017    Family History  Problem Relation Age of Onset  . Von Willebrand disease Mother   . Prostate cancer Brother   . Bladder Cancer Maternal Grandfather     Social History   Socioeconomic History  . Marital status: Married    Spouse name: Gwenlyn Perking  . Number of children: 2  . Years of education: Not on file  . Highest education level: Not on file  Occupational History  . Not on file  Social Needs  . Financial resource strain: Not hard at all  . Food insecurity:    Worry: Never true    Inability: Never true  . Transportation needs:    Medical: No    Non-medical: No  Tobacco Use  . Smoking status: Never Smoker  . Smokeless tobacco: Never Used  Substance and Sexual Activity  . Alcohol use: No    Alcohol/week: 0.0 standard drinks  . Drug use: No  . Sexual activity: Yes    Partners: Male  Birth control/protection: Post-menopausal  Lifestyle  . Physical activity:    Days per week: 0 days    Minutes per session: 0 min  . Stress: Not at all  Relationships  . Social connections:    Talks on phone: Three times a week    Gets together: Three times a week    Attends religious service: More than 4 times per year    Active member of club or organization: No    Attends meetings of clubs or organizations: Never    Relationship status: Married  . Intimate partner violence:    Fear of current or ex partner: No    Emotionally abused: No    Physically abused: No    Forced sexual activity: No  Other Topics Concern  . Not on file  Social History Narrative  . Not on file    Review of Systems: See HPI, otherwise  negative ROS  Physical Exam: BP 133/70   Pulse 64   Temp (!) 96.5 F (35.8 C) (Tympanic)   Resp 18   Ht 5\' 4"  (1.626 m)   Wt 46.3 kg   SpO2 100%   BMI 17.51 kg/m  General:   Alert,  pleasant and cooperative in NAD Head:  Normocephalic and atraumatic. Neck:  Supple; no masses or thyromegaly. Lungs:  Clear throughout to auscultation, normal respiratory effort.    Heart:  +S1, +S2, Regular rate and rhythm, No edema. Abdomen:  Soft, nontender and nondistended. Normal bowel sounds, without guarding, and without rebound.   Neurologic:  Alert and  oriented x4;  grossly normal neurologically.  Impression/Plan: Kristina Christian is here for an colonoscopy to be performed for Screening colonoscopy average risk   Risks, benefits, limitations, and alternatives regarding  colonoscopy have been reviewed with the patient.  Questions have been answered.  All parties agreeable.   Jonathon Bellows, MD  05/20/2018, 8:18 AM

## 2018-05-21 ENCOUNTER — Encounter: Payer: Self-pay | Admitting: Gastroenterology

## 2018-05-23 ENCOUNTER — Telehealth: Payer: Self-pay | Admitting: Gastroenterology

## 2018-05-23 LAB — SURGICAL PATHOLOGY

## 2018-05-23 NOTE — Telephone Encounter (Signed)
Patient was calling again to check on results of pathology.I explained that once they come in & Dr Vicente Males  Has reviewed them she should received a call. I verified her numbers.

## 2018-05-23 NOTE — Telephone Encounter (Signed)
Pt left vm she had a procedure with Dr. Vicente Males and had 3 polyps removed and would like to know the results

## 2018-05-25 ENCOUNTER — Encounter: Payer: Self-pay | Admitting: Gastroenterology

## 2018-05-29 NOTE — Telephone Encounter (Signed)
Pt left vm she is waiting on pathology report on 3 polyps removed

## 2018-05-29 NOTE — Telephone Encounter (Signed)
Patient has been provided with results of pathology as reported on her results letter.  Informed her she should be getting this letter in the mail soon for her records.  Thanks [employer]

## 2019-09-23 ENCOUNTER — Telehealth: Payer: Self-pay

## 2019-09-23 NOTE — Telephone Encounter (Signed)
Please schedule an appt for referral

## 2019-09-23 NOTE — Telephone Encounter (Signed)
Copied from [place] [id number]. Topic: Referral - Request for Referral >> Sep 23, 2019  3:19 PM Erick Blinks wrote: Has patient seen PCP for this complaint? Yes (Dr. Sanda Klein) *If NO, is insurance requiring patient see PCP for this issue before PCP can refer them? Referral for which specialty: Vein and Vascular Preferred provider/office: [facility] - Dr. Lucky Cowboy  Reason for referral: Poor circulation in her legs >> Sep 23, 2019  3:40 PM Cathrine Muster, CMA wrote: Patient needs an appointment

## 2019-09-24 NOTE — Telephone Encounter (Signed)
lvm for pt to return call to sch appt.

## 2019-09-28 ENCOUNTER — Encounter: Payer: Self-pay | Admitting: Family Medicine

## 2019-09-28 ENCOUNTER — Ambulatory Visit (INDEPENDENT_AMBULATORY_CARE_PROVIDER_SITE_OTHER): Payer: 59 | Admitting: Family Medicine

## 2019-09-28 VITALS — Ht 64.0 in | Wt 95.0 lb

## 2019-09-28 DIAGNOSIS — I8390 Asymptomatic varicose veins of unspecified lower extremity: Secondary | ICD-10-CM | POA: Diagnosis not present

## 2019-09-28 DIAGNOSIS — R252 Cramp and spasm: Secondary | ICD-10-CM

## 2019-09-28 DIAGNOSIS — I8393 Asymptomatic varicose veins of bilateral lower extremities: Secondary | ICD-10-CM

## 2019-09-28 NOTE — Progress Notes (Signed)
Name: Kristina Christian   MRN: [id number]    DOB: 03/19/1956   Date:09/28/2019       Progress Note  Subjective:   I connected with  Kristina Christian on 09/28/19 at  2:00 PM EST by telephone and verified that I am speaking with the correct person using two identifiers.   I discussed the limitations, risks, security and privacy concerns of performing an evaluation and management service by telephone and the availability of in person appointments. Staff also discussed with the patient that there may be a patient responsible charge related to this service. Patient Location: home Provider Location: [facility] Additional Individuals present: none  Chief Complaint  Chief Complaint  Patient presents with  . Leg cramping    had discussed with Dr. Sanda Klein previously, cramping getting worse, would like to see Vascular      Previously discussed in 2019 with Dr. Sanda Klein her past PCP  June cramps of b/l lower extremities: Cramps of lower extremity    will check Mg2+, K+, Ca2+; suggested tonic water; continue work-up if nothing obvious   Relevant Orders   Magnesium   Labs were normal and Dr. Sanda Klein advised trying tonic water - no improvement with that either Pt was told she would be referred to vascular specialist, but unfortunately with covid pandemic and PCP retiring pt as lost to f/up and I do not see any referrals put in or pt appt or calls - but she has had a very difficult time getting through the phones.  Pt reports current and prior b/l leg cramping and spasming, she will have toes, legs, arch of foot, calves all spasm, contract, cramp- frequent daily muscle spasms triggered with small movementof her legs or feet, symptoms are more severe at night - often 2-3 x a night she will fly out of bed.  She reports family hx of "bad circulation" and she also reports gradual onset of veins to LE's worse in ankles and worsening and progressive moving up her legs a few inches over the past 1-2 years.  She is on  B-complex and magnesium supplements  She denies any LE rash, swelling, wounds sores, skin or hair changes, denies claudications sx.     Patient Active Problem List   Diagnosis Date Noted  . Preventative health care 05/05/2018  . Spider vein of lower extremity 01/06/2018  . Chronic bronchitis with acute exacerbation ([place]) 06/11/2017  . Chronic bronchitis ([place]) 06/11/2017  . 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 Atopic dermatitis 09/19/2015  . Nodule of finger of both hands 09/19/2015  . 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 Infected sebaceous cyst of skin 03/22/2015  . Sun-induced skin changes, keratosis 03/22/2015  . LBP (low back pain) 01/27/2015  . Osteoporosis 01/27/2015  . Leg varices 01/27/2015  . Allergic rhinitis, seasonal 01/27/2015  . RAD (reactive airway disease) 01/27/2015    Social History   Tobacco Use  . Smoking status: Never Smoker  . Smokeless tobacco: Never Used  Substance Use Topics  . Alcohol use: No    Alcohol/week: 0.0 standard drinks     Current Outpatient Medications:  .  albuterol (PROVENTIL HFA;VENTOLIN HFA) 108 (90 Base) MCG/ACT inhaler, Inhale 2 puffs every 4 (four) hours as needed into the lungs for wheezing or shortness of breath., Disp: 1 Inhaler, Rfl: 1 .  b complex vitamins capsule, Take 1 capsule by mouth daily., Disp: , Rfl:  .  BIOFLAVONOID PRODUCTS ER PO, Take by mouth., Disp: , Rfl:  .  calcium carbonate (OS-CAL)  600 MG TABS tablet, Take 500 mg by mouth 2 (two) times daily. , Disp: , Rfl:  .  cholecalciferol (VITAMIN D) 1000 units tablet, Take 1,000 Units by mouth daily., Disp: , Rfl:  .  fluticasone (FLONASE) 50 MCG/ACT nasal spray, Place 2 sprays as needed into both nostrils., Disp: 16 g, Rfl: 11 .  levocetirizine (XYZAL ALLERGY 24HR) 5 MG tablet, Take 5 mg by mouth as needed for allergies., Disp: , Rfl:  .  Magnesium Citrate 100 MG TABS, Take 1 tablet by mouth daily., Disp: , Rfl:  .  MULTIPLE VITAMINS PO, Take daily by mouth. , Disp: , Rfl:    .  vitamin C (ASCORBIC ACID) 500 MG tablet, Take 500 mg by mouth daily., Disp: , Rfl:  .  zinc gluconate 50 MG tablet, Take 50 mg by mouth daily., Disp: , Rfl:   Allergies  Allergen Reactions  . Breo Ellipta [Fluticasone Furoate-Vilanterol]     Chart Review: Wt Readings from Last 5 Encounters:  09/28/19 95 lb (43.1 kg)  05/20/18 102 lb (46.3 kg)  04/22/18 99 lb 9.6 oz (45.2 kg)  01/06/18 100 lb 9.6 oz (45.6 kg)  10/01/17 100 lb 9.6 oz (45.6 kg)   BMI Readings from Last 5 Encounters:  09/28/19 16.31 kg/m  05/20/18 17.51 kg/m  04/22/18 17.10 kg/m  01/06/18 16.74 kg/m  10/01/17 16.80 kg/m   I personally reviewed active problem list, medication list, allergies, family history, social history, health maintenance, notes from last encounter, lab results, imaging with the patient/caregiver today.   Review of Systems 10 Systems reviewed and are negative for acute change except as noted in the HPI.   Objective:    Virtual encounter, vitals limited, only able to obtain the following Today's Vitals   09/28/19 1314  Weight: 95 lb (43.1 kg)  Height: 5\' 4"  (1.626 m)  PainSc: 0-No pain   Body mass index is 16.31 kg/m. Nursing Note and Vital Signs reviewed.  Physical Exam Phonation clear, alert phonation, no apparent distress   PE limited by telephone encounter  No results found for this or any previous visit (from the past 72 hour(s)).  Assessment and Plan:     ICD-10-CM   1. Cramps of lower extremity  R25.2 Ambulatory referral to Vascular Surgery  2. Spider vein of lower extremity  I83.90 Ambulatory referral to Vascular Surgery  3. Varicose veins of both lower extremities, unspecified whether complicated  [id number] Ambulatory referral to Vascular Surgery  Requests referral to Dr. Lucky Cowboy for dx above, previously evaluated and tx by Dr. Sanda Klein, sx never improved with any tx, and pt has worsening sx and reticular veins to b/l LE She paid for testing/screening-  checked ABIs  and they were normal per pt report - she has record with her Supplemental mag and B-complex has not helped with sx Offered meds for RLS and muscle relaxers but pt preferred to not start meds and wait to see vascular spec.    - I discussed the assessment and treatment plan with the patient. The patient was provided an opportunity to ask questions and all were answered. The patient agreed with the plan and demonstrated an understanding of the instructions.  - The patient was advised to call back or seek an in-person evaluation if the symptoms worsen or if the condition fails to improve as anticipated.   I provided 21 minutes of non-face-to-face time during this encounter.  Delsa Grana, PA-C 09/28/19 1:29 PM

## 2019-10-05 ENCOUNTER — Other Ambulatory Visit (INDEPENDENT_AMBULATORY_CARE_PROVIDER_SITE_OTHER): Payer: Self-pay | Admitting: Nurse Practitioner

## 2019-10-05 DIAGNOSIS — I8390 Asymptomatic varicose veins of unspecified lower extremity: Secondary | ICD-10-CM

## 2019-10-06 ENCOUNTER — Ambulatory Visit (INDEPENDENT_AMBULATORY_CARE_PROVIDER_SITE_OTHER): Payer: 59

## 2019-10-06 ENCOUNTER — Ambulatory Visit (INDEPENDENT_AMBULATORY_CARE_PROVIDER_SITE_OTHER): Payer: 59 | Admitting: Vascular Surgery

## 2019-10-06 ENCOUNTER — Encounter (INDEPENDENT_AMBULATORY_CARE_PROVIDER_SITE_OTHER): Payer: Self-pay | Admitting: Vascular Surgery

## 2019-10-06 ENCOUNTER — Other Ambulatory Visit: Payer: Self-pay

## 2019-10-06 VITALS — BP 131/72 | HR 58 | Resp 16 | Ht 64.0 in | Wt 97.6 lb

## 2019-10-06 DIAGNOSIS — I872 Venous insufficiency (chronic) (peripheral): Secondary | ICD-10-CM | POA: Diagnosis not present

## 2019-10-06 DIAGNOSIS — I83813 Varicose veins of bilateral lower extremities with pain: Secondary | ICD-10-CM | POA: Diagnosis not present

## 2019-10-06 DIAGNOSIS — I8390 Asymptomatic varicose veins of unspecified lower extremity: Secondary | ICD-10-CM | POA: Diagnosis not present

## 2019-10-06 NOTE — Patient Instructions (Signed)

## 2019-10-06 NOTE — Progress Notes (Signed)
Patient ID: Kristina Christian, female   DOB: August 06, 1955, 64 y.o.   MRN: [id number]  Chief Complaint  Patient presents with  . New Patient (Initial Visit)    ref Lucio Edward varicose veins    HPI Kristina Christian is a 64 y.o. female.  I am asked to see the patient by L. Lucio Edward, PA-C for evaluation of cramps, discoloration, and varicose veins.  The patient presents with complaints of symptomatic varicosities of the lower extremities. The patient reports a long standing history of varicosities and they have become painful over time. There was no clear inciting event or causative factor that started the symptoms.  The right leg is slightly more severly affected. The patient elevates the legs for relief. The pain is described as cramps that wake her from night sleep and are very difficult to get to go away. The symptoms are generally most severe in the evening, particularly when they have been on their feet for long periods of time.  Compression stockings has been used to try to improve the symptoms with no success.  She has also tried tonic water and other therapies for her cramps with little improvement.  The patient complains of occasional swelling as an associated symptom. The patient has no previous history of deep venous thrombosis or superficial thrombophlebitis to their knowledge.  She has had a [facility] screening which showed no evidence of significant arterial vascular disease in the aorta, carotid arteries, or lower extremity arteries.  She does complain of some numbness in her toes and feet a little worse on the right than the left.     Past Medical History:  Diagnosis Date  . Acute upper respiratory infection   . Allergy   . Chronic bronchitis ([place]) 06/11/2017  . Fibrocystic breast   . Osteoporosis   . Sebaceous cyst   . Synovial cyst   . Varicose veins of lower extremity     Past Surgical History:  Procedure Laterality Date  . COLONOSCOPY WITH PROPOFOL N/A 05/20/2018   Procedure:  COLONOSCOPY WITH PROPOFOL;  Surgeon: Jonathon Bellows, MD;  Location: [facility] ENDOSCOPY;  Service: Gastroenterology;  Laterality: N/A;  . TONSILLECTOMY AND ADENOIDECTOMY  04/22/2007    Family History  Problem Relation Age of Onset  . Von Willebrand disease Mother   . Prostate cancer Brother   . Bladder Cancer Maternal Grandfather   No aneurysms   Social History   Tobacco Use  . Smoking status: Never Smoker  . Smokeless tobacco: Never Used  Substance Use Topics  . Alcohol use: No    Alcohol/week: 0.0 standard drinks  . Drug use: No     Allergies  Allergen Reactions  . Breo Ellipta [Fluticasone Furoate-Vilanterol]     Current Outpatient Medications  Medication Sig Dispense Refill  . albuterol (PROVENTIL HFA;VENTOLIN HFA) 108 (90 Base) MCG/ACT inhaler Inhale 2 puffs every 4 (four) hours as needed into the lungs for wheezing or shortness of breath. 1 Inhaler 1  . b complex vitamins capsule Take 1 capsule by mouth daily.    Marland Kitchen BIOFLAVONOID PRODUCTS ER PO Take by mouth.    . calcium carbonate (OS-CAL) 600 MG TABS tablet Take 500 mg by mouth 2 (two) times daily.     . cholecalciferol (VITAMIN D) 1000 units tablet Take 1,000 Units by mouth daily.    . fluticasone (FLONASE) 50 MCG/ACT nasal spray Place 2 sprays as needed into both nostrils. 16 g 11  . levocetirizine (XYZAL ALLERGY 24HR) 5 MG tablet Take 5 mg by  mouth as needed for allergies.    . Magnesium Citrate 100 MG TABS Take 1 tablet by mouth daily.    . MULTIPLE VITAMINS PO Take daily by mouth.     . vitamin C (ASCORBIC ACID) 500 MG tablet Take 500 mg by mouth daily.    Marland Kitchen zinc gluconate 50 MG tablet Take 50 mg by mouth daily.     No current facility-administered medications for this visit.      REVIEW OF SYSTEMS (Negative unless checked)  Constitutional: [] Weight loss  [] Fever  [] Chills Cardiac: [] Chest pain   [] Chest pressure   [] Palpitations   [] Shortness of breath when laying flat   [] Shortness of breath at rest    [] Shortness of breath with exertion. Vascular:  [] Pain in legs with walking   [] Pain in legs at rest   [] Pain in legs when laying flat   [] Claudication   [] Pain in feet when walking  [] Pain in feet at rest  [] Pain in feet when laying flat   [] History of DVT   [] Phlebitis   [] Swelling in legs   [x] Varicose veins   [] Non-healing ulcers Pulmonary:   [] Uses home oxygen   [] Productive cough   [] Hemoptysis   [] Wheeze  [] COPD   [] Asthma Neurologic:  [] Dizziness  [] Blackouts   [] Seizures   [] History of stroke   [] History of TIA  [] Aphasia   [] Temporary blindness   [] Dysphagia   [] Weakness or numbness in arms   [x] Weakness or numbness in legs Musculoskeletal:  [x] Arthritis   [] Joint swelling   [] Joint pain   [] Low back pain Hematologic:  [] Easy bruising  [] Easy bleeding   [] Hypercoagulable state   [] Anemic  [] Hepatitis Gastrointestinal:  [] Blood in stool   [] Vomiting blood  [] Gastroesophageal reflux/heartburn   [] Abdominal pain Genitourinary:  [] Chronic kidney disease   [] Difficult urination  [] Frequent urination  [] Burning with urination   [] Hematuria Skin:  [] Rashes   [] Ulcers   [] Wounds Psychological:  [] History of anxiety   []  History of major depression.    Physical Exam BP 131/72 (BP Location: Right Arm)   Pulse (!) 58   Resp 16   Ht 5\' 4"  (1.626 m)   Wt 97 lb 9.6 oz (44.3 kg)   BMI 16.75 kg/m  Gen: Thin, appears younger than stated age, NAD Head: [place]/AT, No temporalis wasting.  Ear/Nose/Throat: Hearing grossly intact, dentition good Eyes: Sclera non-icteric. Conjunctiva clear Neck: Supple. Trachea midline Pulmonary:  Good air movement, no use of accessory muscles, respirations not labored.  Cardiac: RRR, No JVD Vascular: Varicosities diffuse and measuring up to 1-2 mm in the right lower extremity        Varicosities diffuse and measuring up to 1-2 mm in the left lower extremity Vessel Right Left  Radial Palpable Palpable                          PT Palpable Palpable  DP Palpable  Palpable   Gastrointestinal: soft, non-tender/non-distended.  Musculoskeletal: M/S 5/5 throughout.  Mild stasis dermatitis changes are present in the right leg.  No appreciable lower extremity edema Neurologic: Sensation grossly intact in extremities.  Symmetrical.  Speech is fluent.  Psychiatric: Judgment intact, Mood & affect appropriate for pt's clinical situation. Dermatologic: No rashes or ulcers noted.  No cellulitis or open wounds.    Radiology No results found.  Labs No results found for this or any previous visit (from the past 2160 hour(s)).  Assessment/Plan:  Varicose veins of leg with  pain, bilateral Patient complains of symptomatic varicosities of the lower extremities.  We had a very long discussion today regarding the natural history and pathophysiology of venous disease.  We had an opening for an ultrasound's we went ahead and did a venous reflux study today as she has already tried compression stockings for some time with no improvement, elevates her legs, and is very often not overweight.  Her venous reflux study showed no DVT or superficial thrombophlebitis.  The left leg had absolutely no venous reflux and was a normal study.  The right leg had insignificant reflux in the right great saphenous vein only in the calf.  Given these findings, there is no role for venous intervention that would likely help her symptoms.  Sclerotherapy can be done for her painful superficial varicosities and hypertonic saline can be used but we are not doing this outside of post laser sclerotherapy at this point.  I discussed that some of the vein centers and the dermatologist in the area may consider this.  At this point, I recommended typical maneuvers for her cramps including stretching, tonic water, mustard, and bananas.  I will see her back on an as-needed basis.  Venous stasis dermatitis of right lower extremity She does have some venous stasis changes on the right leg with her very mild  reflux.  This would not be severe enough to warrant a laser ablation at this time.  Recommend compression, elevation, and continued activity.  Could be reassessed in the future if this worsens.      Leotis Pain 10/06/2019, 3:18 PM   This note was created with Dragon medical transcription system.  Any errors from dictation are unintentional.

## 2019-10-06 NOTE — Assessment & Plan Note (Signed)
Patient complains of symptomatic varicosities of the lower extremities.  We had a very long discussion today regarding the natural history and pathophysiology of venous disease.  We had an opening for an ultrasound's we went ahead and did a venous reflux study today as she has already tried compression stockings for some time with no improvement, elevates her legs, and is very often not overweight.  Her venous reflux study showed no DVT or superficial thrombophlebitis.  The left leg had absolutely no venous reflux and was a normal study.  The right leg had insignificant reflux in the right great saphenous vein only in the calf.  Given these findings, there is no role for venous intervention that would likely help her symptoms.  Sclerotherapy can be done for her painful superficial varicosities and hypertonic saline can be used but we are not doing this outside of post laser sclerotherapy at this point.  I discussed that some of the vein centers and the dermatologist in the area may consider this.  At this point, I recommended typical maneuvers for her cramps including stretching, tonic water, mustard, and bananas.  I will see her back on an as-needed basis.

## 2019-10-06 NOTE — Assessment & Plan Note (Signed)
She does have some venous stasis changes on the right leg with her very mild reflux.  This would not be severe enough to warrant a laser ablation at this time.  Recommend compression, elevation, and continued activity.  Could be reassessed in the future if this worsens.

## 2019-10-16 ENCOUNTER — Encounter (INDEPENDENT_AMBULATORY_CARE_PROVIDER_SITE_OTHER): Payer: Self-pay | Admitting: Nurse Practitioner

## 2019-10-16 ENCOUNTER — Encounter (INDEPENDENT_AMBULATORY_CARE_PROVIDER_SITE_OTHER): Payer: Self-pay

## 2019-11-10 ENCOUNTER — Encounter (INDEPENDENT_AMBULATORY_CARE_PROVIDER_SITE_OTHER): Payer: Self-pay

## 2019-11-10 ENCOUNTER — Encounter (INDEPENDENT_AMBULATORY_CARE_PROVIDER_SITE_OTHER): Payer: Self-pay | Admitting: Vascular Surgery

## 2019-11-19 ENCOUNTER — Ambulatory Visit: Payer: 59 | Attending: Internal Medicine

## 2019-11-19 DIAGNOSIS — Z20822 Contact with and (suspected) exposure to covid-19: Secondary | ICD-10-CM | POA: Insufficient documentation

## 2019-11-20 LAB — SARS-COV-2, NAA 2 DAY TAT

## 2019-11-20 LAB — NOVEL CORONAVIRUS, NAA: SARS-CoV-2, NAA: NOT DETECTED

## 2019-12-05 ENCOUNTER — Ambulatory Visit: Payer: Self-pay | Attending: Internal Medicine

## 2019-12-05 DIAGNOSIS — Z23 Encounter for immunization: Secondary | ICD-10-CM

## 2019-12-05 NOTE — Progress Notes (Signed)
   Covid-19 Vaccination Clinic  Name:  Kristina Christian    MRN: [id number] DOB: May 04, 1956  12/05/2019  Kristina Christian was observed post Covid-19 immunization for 30 minutes based on pre-vaccination screening without incident. She was provided with Vaccine Information Sheet and instruction to access the V-Safe system.   Kristina Christian was instructed to call 911 with any severe reactions post vaccine: Marland Kitchen Difficulty breathing  . Swelling of face and throat  . A fast heartbeat  . A bad rash all over body  . Dizziness and weakness   Immunizations Administered    Name Date Dose VIS Date Route   Pfizer COVID-19 Vaccine 12/05/2019 10:04 AM 0.3 mL 09/16/2018 Intramuscular   Manufacturer: [place]   Lot: [id number]   [place]: [id number]

## 2019-12-23 ENCOUNTER — Other Ambulatory Visit: Payer: Self-pay

## 2019-12-23 ENCOUNTER — Ambulatory Visit (INDEPENDENT_AMBULATORY_CARE_PROVIDER_SITE_OTHER): Payer: 59 | Admitting: Family Medicine

## 2019-12-23 ENCOUNTER — Encounter: Payer: Self-pay | Admitting: Family Medicine

## 2019-12-23 VITALS — BP 122/70 | HR 86 | Temp 97.8°F | Resp 14 | Ht 64.0 in | Wt 95.1 lb

## 2019-12-23 DIAGNOSIS — M810 Age-related osteoporosis without current pathological fracture: Secondary | ICD-10-CM

## 2019-12-23 DIAGNOSIS — G2581 Restless legs syndrome: Secondary | ICD-10-CM | POA: Diagnosis not present

## 2019-12-23 DIAGNOSIS — R252 Cramp and spasm: Secondary | ICD-10-CM | POA: Diagnosis not present

## 2019-12-23 DIAGNOSIS — R202 Paresthesia of skin: Secondary | ICD-10-CM

## 2019-12-23 NOTE — Patient Instructions (Addendum)
Health Maintenance  Topic Date Due  . COVID-19 Vaccine (2 - Pfizer 2-dose series) 12/26/2019  . MAMMOGRAM  12/22/2020 (Originally 10/05/2018)  . Hepatitis C Screening  12/22/2020 (Originally [birth year])  . HIV Screening  12/22/2020 (Originally 04/05/1971)  . INFLUENZA VACCINE  02/21/2020  . DEXA SCAN  03/17/2020  . PAP SMEAR-Modifier  04/22/2021  . COLONOSCOPY  05/20/2021  . TETANUS/TDAP  01/07/2028   [facility] [place],  [place]  [id number] Get Driving Directions Main: [phone]  Call to schedule your bone scan and you can ask about mammogram tests and see if they have anyone who is really good and gentle with imaging or if they suggest any other types of testing or screening - let me know

## 2019-12-23 NOTE — Progress Notes (Signed)
Patient ID: Kristina Christian, female    DOB: 11/14/1955, 64 y.o.   MRN: [id number]  PCP: Delsa Grana, PA-C  Chief Complaint  Patient presents with  . Follow-up    from Dr Lucky Cowboy vascular  . Cyst    on tailbone    Subjective:   Kristina Christian is a 64 y.o. female, presents to clinic with CC of the following:  HPI  Pt complains of continued years of leg cramps On Vit D Calcium Mag 100 mg Vit C, zinc B complex  Leg cramps wake her up every night   She went to vascular for eval: He did not recommend any intervention at this time.  Encounter note reviewed: "Venous stasis dermatitis of right lower extremity She does have some venous stasis changes on the right leg with her very mild reflux.  This would not be severe enough to warrant a laser ablation at this time.  Recommend compression, elevation, and continued activity.  Could be reassessed in the future if this worsens.  Pt finished olendronate in Sept 2020, last dexa 2019 Pt overdue for mammogram, painful for her and she doesn't want to do the screening   Patient Active Problem List   Diagnosis Date Noted  . Venous stasis dermatitis of right lower extremity 10/06/2019  . Preventative health care 05/05/2018  . Spider vein of lower extremity 01/06/2018  . Chronic bronchitis with acute exacerbation ([place]) 06/11/2017  . Chronic bronchitis ([place]) 06/11/2017  . 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 Atopic dermatitis 09/19/2015  . Nodule of finger of both hands 09/19/2015  . 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 Infected sebaceous cyst of skin 03/22/2015  . Sun-induced skin changes, keratosis 03/22/2015  . LBP (low back pain) 01/27/2015  . Osteoporosis 01/27/2015  . Varicose veins of leg with pain, bilateral 01/27/2015  . Allergic rhinitis, seasonal 01/27/2015  . RAD (reactive airway disease) 01/27/2015      Current Outpatient Medications:  .  albuterol (PROVENTIL HFA;VENTOLIN HFA) 108 (90 Base) MCG/ACT inhaler, Inhale 2 puffs every 4  (four) hours as needed into the lungs for wheezing or shortness of breath., Disp: 1 Inhaler, Rfl: 1 .  b complex vitamins capsule, Take 1 capsule by mouth daily., Disp: , Rfl:  .  calcium carbonate (OS-CAL) 600 MG TABS tablet, Take 500 mg by mouth 2 (two) times daily. , Disp: , Rfl:  .  cholecalciferol (VITAMIN D) 1000 units tablet, Take 1,000 Units by mouth daily., Disp: , Rfl:  .  fluticasone (FLONASE) 50 MCG/ACT nasal spray, Place 2 sprays as needed into both nostrils., Disp: 16 g, Rfl: 11 .  levocetirizine (XYZAL ALLERGY 24HR) 5 MG tablet, Take 5 mg by mouth as needed for allergies., Disp: , Rfl:  .  Magnesium Citrate 100 MG TABS, Take 1 tablet by mouth daily., Disp: , Rfl:  .  MULTIPLE VITAMINS PO, Take daily by mouth. , Disp: , Rfl:  .  vitamin C (ASCORBIC ACID) 500 MG tablet, Take 500 mg by mouth daily., Disp: , Rfl:  .  zinc gluconate 50 MG tablet, Take 50 mg by mouth daily., Disp: , Rfl:    Allergies  Allergen Reactions  . Breo Ellipta [Fluticasone Furoate-Vilanterol]      Family History  Problem Relation Age of Onset  . Von Willebrand disease Mother   . Prostate cancer Brother   . Bladder Cancer Maternal Grandfather      Social History   Socioeconomic History  . Marital status: Married    Spouse  name: Gwenlyn Perking  . Number of children: 2  . Years of education: Not on file  . Highest education level: Not on file  Occupational History  . Not on file  Tobacco Use  . Smoking status: Never Smoker  . Smokeless tobacco: Never Used  Substance and Sexual Activity  . Alcohol use: No    Alcohol/week: 0.0 standard drinks  . Drug use: No  . Sexual activity: Yes    Partners: Male    Birth control/protection: Post-menopausal  Other Topics Concern  . Not on file  Social History Narrative  . Not on file   Social Determinants of Health   Financial Resource Strain:   . Difficulty of Paying Living Expenses:   Food Insecurity:   . Worried About Charity fundraiser in the Last  Year:   . Arboriculturist in the Last Year:   Transportation Needs:   . Film/video editor (Medical):   Marland Kitchen Lack of Transportation (Non-Medical):   Physical Activity:   . Days of Exercise per Week:   . Minutes of Exercise per Session:   Stress:   . Feeling of Stress :   Social Connections:   . Frequency of Communication with Friends and Family:   . Frequency of Social Gatherings with Friends and Family:   . Attends Religious Services:   . Active Member of Clubs or Organizations:   . Attends Archivist Meetings:   Marland Kitchen Marital Status:   Intimate Partner Violence:   . Fear of Current or Ex-Partner:   . Emotionally Abused:   Marland Kitchen Physically Abused:   . Sexually Abused:     Chart Review Today: I personally reviewed active problem list, medication list, allergies, family history, social history, health maintenance, notes from last encounter, lab results, imaging with the patient/caregiver today.   Review of Systems 10 Systems reviewed and are negative for acute change except as noted in the HPI.     Objective:   Vitals:   12/23/19 0842 12/23/19 0847  BP: (!) 142/72 122/70  Pulse: 86   Resp: 14   Temp: 97.8 F (36.6 C)   SpO2: 99%   Weight: 95 lb 1.6 oz (43.1 kg)   Height: 5\' 4"  (1.626 m)     Body mass index is 16.32 kg/m.  Physical Exam Vitals and nursing note reviewed.  Constitutional:      General: She is not in acute distress.    Appearance: Normal appearance. She is well-developed. She is not ill-appearing, toxic-appearing or diaphoretic.     Interventions: Face mask in place.  HENT:     Head: Normocephalic and atraumatic.     Right Ear: External ear normal.     Left Ear: External ear normal.  Eyes:     General: Lids are normal. No scleral icterus.       Right eye: No discharge.        Left eye: No discharge.     Conjunctiva/sclera: Conjunctivae normal.  Neck:     Trachea: Phonation normal. No tracheal deviation.  Cardiovascular:     Rate and  Rhythm: Normal rate and regular rhythm.     Pulses: Normal pulses.          Radial pulses are 2+ on the right side and 2+ on the left side.       Posterior tibial pulses are 2+ on the right side and 2+ on the left side.     Heart sounds: Normal heart sounds. No  murmur. No friction rub. No gallop.   Pulmonary:     Effort: Pulmonary effort is normal. No respiratory distress.     Breath sounds: Normal breath sounds. No stridor. No wheezing, rhonchi or rales.  Chest:     Chest wall: No tenderness.  Abdominal:     General: Bowel sounds are normal. There is no distension.     Palpations: Abdomen is soft.     Tenderness: There is no abdominal tenderness. There is no guarding or rebound.  Musculoskeletal:        General: No deformity. Normal range of motion.     Cervical back: Normal range of motion and neck supple.     Right lower leg: No edema.     Left lower leg: No edema.  Lymphadenopathy:     Cervical: No cervical adenopathy.  Skin:    General: Skin is warm and dry.     Capillary Refill: Capillary refill takes less than 2 seconds.     Coloration: Skin is not jaundiced or pale.     Findings: No rash.  Neurological:     Mental Status: She is alert and oriented to person, place, and time.     Motor: No abnormal muscle tone.     Gait: Gait normal.  Psychiatric:        Speech: Speech normal.        Behavior: Behavior normal.      Results for orders placed or performed in visit on 11/19/19  Novel Coronavirus, NAA ([facility])   Specimen: Nasopharyngeal(NP) swabs in vial transport medium   NASOPHARYNGE  TESTING  Result Value Ref Range   SARS-CoV-2, NAA Not Detected Not Detected  SARS-COV-2, NAA 2 DAY TAT   NASOPHARYNGE  TESTING  Result Value Ref Range   SARS-CoV-2, NAA 2 DAY TAT Performed         Assessment & Plan:      ICD-10-CM   1. Age-related osteoporosis without current pathological fracture  M81.0 DG Bone Density   She wanted to recheck her bone scan right now but  explained that she needs to repeat in about 2 years from last   2. Paresthesia of both lower extremities  R20.2 TSH    T4, free    Iron, TIBC and Ferritin Panel    CBC (INCLUDES DIFF/PLT) WITH PATHOLOGIST REVIEW    B12 and Folate Panel    COMPLETE METABOLIC PANEL WITH GFR    Magnesium   continued frustration re years of LE symptoms - offered additional labs to r/o other possible causes  3. Leg cramps  R25.2 TSH    T4, free    Iron, TIBC and Ferritin Panel    CBC (INCLUDES DIFF/PLT) WITH PATHOLOGIST REVIEW    B12 and Folate Panel    COMPLETE METABOLIC PANEL WITH GFR    Magnesium   She is on supplements, will recheck electrolytes, magnesium  4. RLS (restless legs syndrome)  G25.81    offered her meds for RLS - she refused Rx     Explained to the patient that we may not always find lab abnormalities to explain patient's symptoms and unfortunately medicine is not an Chief Strategy Officer.  I offered other referrals and medications to her but she refused. I explained that the labs may be expensive or poorly covered but she did want to proceed.  Leg pain could possibly have a low back etiology?  Even if labs are normal she may choose to add additional supplements to see if they  help, this would be an option as long as supplements would not pose a risk of harm  Did discuss and offer gabapentin or Lyrica for her nighttime leg symptoms but she tries to be very naturopathic and very hesitant with prescription medications  She was encouraged to call and get her mammogram scheduled and health maintenance list was given to her with contact information for [place] [facility] at [facility] she is very hesitant about getting her mammogram done has been in quite some time.  She has pain with her mammogram screening she has several complaints about how they were done in the past.  I encouraged her to call and inquire about technologist in her express her concerns and see if they have any when that would be  very gentle and able to help her get her mammogram screening done  Delsa Grana, PA-C 12/23/19 9:05 AM

## 2019-12-24 LAB — CBC (INCLUDES DIFF/PLT) WITH PATHOLOGIST REVIEW
Absolute Monocytes: 545 cells/uL (ref 200–950)
Basophils Absolute: 28 cells/uL (ref 0–200)
Basophils Relative: 0.5 %
Eosinophils Absolute: 88 cells/uL (ref 15–500)
Eosinophils Relative: 1.6 %
HCT: 41.5 % (ref 35.0–45.0)
Hemoglobin: 13.5 g/dL (ref 11.7–15.5)
Lymphs Abs: 1012 cells/uL (ref 850–3900)
MCH: 32.7 pg (ref 27.0–33.0)
MCHC: 32.5 g/dL (ref 32.0–36.0)
MCV: 100.5 fL — ABNORMAL HIGH (ref 80.0–100.0)
MPV: 10.5 fL (ref 7.5–12.5)
Monocytes Relative: 9.9 %
Neutro Abs: 3828 cells/uL (ref 1500–7800)
Neutrophils Relative %: 69.6 %
Platelets: 224 10*3/uL (ref 140–400)
RBC: 4.13 10*6/uL (ref 3.80–5.10)
RDW: 11.5 % (ref 11.0–15.0)
Total Lymphocyte: 18.4 %
WBC: 5.5 10*3/uL (ref 3.8–10.8)

## 2019-12-24 LAB — COMPLETE METABOLIC PANEL WITH GFR
AG Ratio: 1.8 (calc) (ref 1.0–2.5)
ALT: 15 U/L (ref 6–29)
AST: 18 U/L (ref 10–35)
Albumin: 4.6 g/dL (ref 3.6–5.1)
Alkaline phosphatase (APISO): 70 U/L (ref 37–153)
BUN: 21 mg/dL (ref 7–25)
CO2: 29 mmol/L (ref 20–32)
Calcium: 9.5 mg/dL (ref 8.6–10.4)
Chloride: 105 mmol/L (ref 98–110)
Creat: 0.72 mg/dL (ref 0.50–0.99)
GFR, Est African American: 103 mL/min/{1.73_m2} (ref >=60)
GFR, Est Non African American: 89 mL/min/{1.73_m2} (ref >=60)
Globulin: 2.6 g/dL (calc) (ref 1.9–3.7)
Glucose, Bld: 77 mg/dL (ref 65–99)
Potassium: 4.2 mmol/L (ref 3.5–5.3)
Sodium: 141 mmol/L (ref 135–146)
Total Bilirubin: 0.6 mg/dL (ref 0.2–1.2)
Total Protein: 7.2 g/dL (ref 6.1–8.1)

## 2019-12-24 LAB — MAGNESIUM: Magnesium: 2.2 mg/dL (ref 1.5–2.5)

## 2019-12-24 LAB — B12 AND FOLATE PANEL
Folate: >24 ng/mL
Vitamin B-12: 1125 pg/mL — ABNORMAL HIGH (ref 200–1100)

## 2019-12-24 LAB — IRON,TIBC AND FERRITIN PANEL
%SAT: 26 % (calc) (ref 16–45)
Ferritin: 51 ng/mL (ref 16–288)
Iron: 88 ug/dL (ref 45–160)
TIBC: 335 mcg/dL (calc) (ref 250–450)

## 2019-12-24 LAB — TSH: TSH: 2.34 mIU/L (ref 0.40–4.50)

## 2019-12-24 LAB — T4, FREE: Free T4: 1.2 ng/dL (ref 0.8–1.8)

## 2019-12-29 ENCOUNTER — Ambulatory Visit: Payer: Self-pay | Attending: Internal Medicine

## 2019-12-29 DIAGNOSIS — Z23 Encounter for immunization: Secondary | ICD-10-CM

## 2019-12-29 NOTE — Progress Notes (Signed)
   Covid-19 Vaccination Clinic  Name:  Kristina Christian    MRN: [id number] DOB: 1956/02/25  12/29/2019  [name] was observed post Covid-19 immunization for 30 minutes based on pre-vaccination screening without incident. She was provided with Vaccine Information Sheet and instruction to access the V-Safe system.   [name] was instructed to call 911 with any severe reactions post vaccine: Marland Kitchen Difficulty breathing  . Swelling of face and throat  . A fast heartbeat  . A bad rash all over body  . Dizziness and weakness   Immunizations Administered    Name Date Dose VIS Date Route   Pfizer COVID-19 Vaccine 12/29/2019 10:33 AM 0.3 mL 09/16/2018 Intramuscular   Manufacturer: [place]   Lot: [id number]   [place]: [id number]

## 2019-12-31 ENCOUNTER — Encounter: Payer: Self-pay | Admitting: Family Medicine

## 2020-03-07 ENCOUNTER — Ambulatory Visit (INDEPENDENT_AMBULATORY_CARE_PROVIDER_SITE_OTHER): Payer: 59 | Admitting: Internal Medicine

## 2020-03-07 ENCOUNTER — Encounter: Payer: Self-pay | Admitting: Internal Medicine

## 2020-03-07 ENCOUNTER — Other Ambulatory Visit: Payer: Self-pay

## 2020-03-07 VITALS — BP 102/68 | HR 67 | Temp 97.9°F | Resp 16 | Ht 64.0 in | Wt 96.0 lb

## 2020-03-07 DIAGNOSIS — M25561 Pain in right knee: Secondary | ICD-10-CM | POA: Diagnosis not present

## 2020-03-07 DIAGNOSIS — M7050 Other bursitis of knee, unspecified knee: Secondary | ICD-10-CM | POA: Diagnosis not present

## 2020-03-07 NOTE — Patient Instructions (Addendum)
Continue with the RICE modalities as you are doing presently as we discussed  Can use a Voltaren gel product to apply topically to help as well in the short-term   Acute Knee Pain, Adult Many things can cause knee pain. Sometimes, knee pain is sudden (acute) and may be caused by damage, swelling, or irritation of the muscles and tissues that support your knee. The pain often goes away on its own with time and rest. If the pain does not go away, tests may be done to find out what is causing the pain. Follow these instructions at home: Pay attention to any changes in your symptoms. Take these actions to relieve your pain. If you have a knee sleeve or brace:   Wear the sleeve or brace as told by your doctor. Remove it only as told by your doctor.  Loosen the sleeve or brace if your toes: ? Tingle. ? Become numb. ? Turn cold and blue.  Keep the sleeve or brace clean.  If the sleeve or brace is not waterproof: ? Do not let it get wet. ? Cover it with a watertight covering when you take a bath or shower. Activity  Rest your knee.  Do not do things that cause pain.  Avoid activities where both feet leave the ground at the same time (high-impact activities). Examples are running, jumping rope, and doing jumping jacks.  Work with a physical therapist to make a safe exercise program, as told by your doctor. Managing pain, stiffness, and swelling   If told, put ice on the knee: ? Put ice in a plastic bag. ? Place a towel between your skin and the bag. ? Leave the ice on for 20 minutes, 2-3 times a day.  If told, put pressure (compression) on your injured knee to control swelling, give support, and help with discomfort. Compression may be done with an elastic bandage. General instructions  Take all medicines only as told by your doctor.  Raise (elevate) your knee while you are sitting or lying down. Make sure your knee is higher than your heart.  Sleep with a pillow under your  knee.  Do not use any products that contain nicotine or tobacco. These include cigarettes, e-cigarettes, and chewing tobacco. These products may slow down healing. If you need help quitting, ask your doctor.  If you are overweight, work with your doctor and a food expert (dietitian) to set goals to lose weight. Being overweight can make your knee hurt more.  Keep all follow-up visits as told by your doctor. This is important. Contact a doctor if:  The knee pain does not stop.  The knee pain changes or gets worse.  You have a fever along with knee pain.  Your knee feels warm when you touch it.  Your knee gives out or locks up. Get help right away if:  Your knee swells, and the swelling gets worse.  You cannot move your knee.  You have very bad knee pain. Summary  Many things can cause knee pain. The pain often goes away on its own with time and rest.  Your doctor may do tests to find out the cause of the pain.  Pay attention to any changes in your symptoms. Relieve your pain with rest, medicines, light activity, and use of ice.  Get help right away if you cannot move your knee or your knee pain is very bad. This information is not intended to replace advice given to you by your health care  provider. Make sure you discuss any questions you have with your health care provider. Document Revised: 12/19/2017 Document Reviewed: 12/19/2017 Elsevier Patient Education  [place].

## 2020-03-07 NOTE — Progress Notes (Signed)
Patient ID: Kristina Christian, female    DOB: [birth year], 64 y.o.   MRN: [id number]  PCP: Delsa Grana, PA-C  Chief Complaint  Patient presents with  . Knee Pain    Right knee pain, started about 5 days    Subjective:   Kristina Christian is a 64 y.o. female, presents to clinic with CC of the following:  Chief Complaint  Patient presents with  . Knee Pain    Right knee pain, started about 5 days    HPI:  Patient is a 64 year old female patient of Delsa Grana Last visit with her was in June 2021 with that note reviewed. Follows up today with knee pain.  At her last visit with [facility], the patient noted challenges with years of lower extremity symptoms including paresthesias and cramping and restless leg concerns.  It was noted she did see vascular, and was not anxious to proceed with any medications consider such as gabapentin or Lyrica product at that time. She has a history of age-related osteoporosis, with her last DEXA in 2019.  She had been on alendronate, noted to finish in 2020.  She noted her right knee pain started about 5 days ago. She had no major trauma before this came on.  She notes she is very active, often gardening, and is on a hill and often has some twisting components and a lot of up and down and bending as part of that, and was doing more that before this came on.  She thinks it is a little swollen at times, although has not been marked.  She feels the discomfort on the inside of her knee when she feels it, often when changes of position or after on her feet.  She has been trying the RICE modalities, and has not taken any medicines to try to help recently.  She denies any major injury to this knee in her past, although notes she often has times where she feels like her kneecap is out of place, and just does some knee motions and it returns, and that is on both knees, and has not increased in the recent past.  She notes the kneecap is never really dislocated by  history.  She denies any calf pains, no increased redness or warmth in the calf, no increased numbness or tingling in the lower extremity, and did note still some concerns with her lower extremities distal to her mid calf region now having  venous stasis changes that are also not new.   Patient Active Problem List   Diagnosis Date Noted  . Venous stasis dermatitis of right lower extremity 10/06/2019  . Preventative health care 05/05/2018  . Spider vein of lower extremity 01/06/2018  . Chronic bronchitis with acute exacerbation ([place]) 06/11/2017  . Chronic bronchitis ([place]) 06/11/2017  . 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 Atopic dermatitis 09/19/2015  . Nodule of finger of both hands 09/19/2015  . 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 Infected sebaceous cyst of skin 03/22/2015  . Sun-induced skin changes, keratosis 03/22/2015  . LBP (low back pain) 01/27/2015  . Osteoporosis 01/27/2015  . Varicose veins of leg with pain, bilateral 01/27/2015  . Allergic rhinitis, seasonal 01/27/2015  . RAD (reactive airway disease) 01/27/2015      Current Outpatient Medications:  .  albuterol (PROVENTIL HFA;VENTOLIN HFA) 108 (90 Base) MCG/ACT inhaler, Inhale 2 puffs every 4 (four) hours as needed into the lungs for wheezing or shortness of breath., Disp: 1 Inhaler, Rfl: 1 .  b complex vitamins capsule, Take 1 capsule by mouth daily., Disp: , Rfl:  .  calcium carbonate (OS-CAL) 600 MG TABS tablet, Take 500 mg by mouth 2 (two) times daily. , Disp: , Rfl:  .  cholecalciferol (VITAMIN D) 1000 units tablet, Take 1,000 Units by mouth daily., Disp: , Rfl:  .  fluticasone (FLONASE) 50 MCG/ACT nasal spray, Place 2 sprays as needed into both nostrils., Disp: 16 g, Rfl: 11 .  levocetirizine (XYZAL ALLERGY 24HR) 5 MG tablet, Take 5 mg by mouth as needed for allergies., Disp: , Rfl:  .  Magnesium Citrate 100 MG TABS, Take 1 tablet by mouth daily., Disp: , Rfl:  .  MULTIPLE VITAMINS PO, Take daily by mouth. , Disp: , Rfl:   .  vitamin C (ASCORBIC ACID) 500 MG tablet, Take 500 mg by mouth daily., Disp: , Rfl:  .  zinc gluconate 50 MG tablet, Take 50 mg by mouth daily., Disp: , Rfl:    Allergies  Allergen Reactions  . Breo Ellipta [Fluticasone Furoate-Vilanterol]      Past Surgical History:  Procedure Laterality Date  . COLONOSCOPY WITH PROPOFOL N/A 05/20/2018   Procedure: COLONOSCOPY WITH PROPOFOL;  Surgeon: Jonathon Bellows, MD;  Location: [facility] ENDOSCOPY;  Service: Gastroenterology;  Laterality: N/A;  . TONSILLECTOMY AND ADENOIDECTOMY  04/22/2007     Family History  Problem Relation Age of Onset  . Von Willebrand disease Mother   . Prostate cancer Brother   . Bladder Cancer Maternal Grandfather      Social History   Tobacco Use  . Smoking status: Never Smoker  . Smokeless tobacco: Never Used  Substance Use Topics  . Alcohol use: No    Alcohol/week: 0.0 standard drinks    With staff assistance, above reviewed with the patient today.  ROS: As per HPI, otherwise no specific complaints on a limited and focused system review   No results found for this or any previous visit (from the past 72 hour(s)).   PHQ2/9: Depression screen [facility] 2/9 03/07/2020 12/23/2019 09/28/2019 04/22/2018 01/06/2018  Decreased Interest 0 0 0 0 0  Down, Depressed, Hopeless 0 0 0 0 0  PHQ - 2 Score 0 0 0 0 0  Altered sleeping 0 0 2 0 -  Tired, decreased energy 0 0 0 0 -  Change in appetite 0 0 0 0 -  Feeling bad or failure about yourself  0 0 0 0 -  Trouble concentrating 0 0 0 0 -  Moving slowly or fidgety/restless 0 0 0 0 -  Suicidal thoughts 0 0 0 0 -  PHQ-9 Score 0 0 2 0 -  Difficult doing work/chores Not difficult at all Not difficult at all Not difficult at all - -   PHQ-2/9 Result is neg  Fall Risk: Fall Risk  03/07/2020 12/23/2019 09/28/2019 04/22/2018 01/06/2018  Falls in the past year? 0 0 0 No No  Number falls in past yr: 0 0 0 - -  Injury with Fall? 0 0 0 - -      Objective:   Vitals:   03/07/20 0830   BP: 102/68  Pulse: 67  Resp: 16  Temp: 97.9 F (36.6 C)  TempSrc: Oral  SpO2: 100%  Weight: 96 lb (43.5 kg)  Height: 5\' 4"  (1.626 m)    Body mass index is 16.48 kg/m.  Physical Exam   NAD, masked, HEENT - [place]/AT, sclera anicteric Ext -  Examined uninjured knee first and stable,  Right knee - Good ROM,  on initial testing, had pain at the very extreme of flexion, although that dissipated as examining.  No pain with full extension, nor with mid motion range of motion testing.  No limitations  Mild crepitus on ROM testing, not marked  No marked effusion, question of subtle swelling more medial and distal  No bruising  NT with movement of the patella, no gross deformity of patella  NT suprapatella bursa region  Mild medial joint line tenderness, no lateral joint line tenderness with palpation, with a prominent medial fat pad noted on exam, that was not markedly tender palpating  Med and lat collaterals intact on testing with no gap, no pain with testing  Lachman neg  And and post drawer neg  McMurrays neg, with no catch identified, although had mild discomfort when testing the medial meniscus  No pain posterior, no obvious Baker's cyst   Neuro/psychiatric - affect was not flat, appropriate with conversation  Alert and oriented  Grossly non-focal - good strength on testing extremities, sensation intact to LT in distal extremities  Speech and gait are normal   Results for orders placed or performed in visit on 12/23/19  TSH  Result Value Ref Range   TSH 2.34 0.40 - 4.50 mIU/L  T4, free  Result Value Ref Range   Free T4 1.2 0.8 - 1.8 ng/dL  Iron, TIBC and Ferritin Panel  Result Value Ref Range   Iron 88 45 - 160 mcg/dL   TIBC 335 250 - 450 mcg/dL (calc)   %SAT 26 16 - 45 % (calc)   Ferritin 51 16 - 288 ng/mL  CBC (INCLUDES DIFF/PLT) WITH PATHOLOGIST REVIEW  Result Value Ref Range   WBC 5.5 3.8 - 10.8 Thousand/uL   RBC 4.13 3.80 - 5.10 Million/uL   Hemoglobin 13.5 11.7 -  15.5 g/dL   HCT 41.5 35 - 45 %   MCV 100.5 (H) 80.0 - 100.0 fL   MCH 32.7 27.0 - 33.0 pg   MCHC 32.5 32.0 - 36.0 g/dL   RDW 11.5 11.0 - 15.0 %   Platelets 224 140 - 400 Thousand/uL   MPV 10.5 7.5 - 12.5 fL   Neutro Abs 3,828 1,500 - 7,800 cells/uL   Lymphs Abs 1,012 850 - 3,900 cells/uL   Absolute Monocytes 545 200 - 950 cells/uL   Eosinophils Absolute 88 15 - 500 cells/uL   Basophils Absolute 28 0 - 200 cells/uL   Neutrophils Relative % 69.6 %   Total Lymphocyte 18.4 %   Monocytes Relative 9.9 %   Eosinophils Relative 1.6 %   Basophils Relative 0.5 %   Comment    B12 and Folate Panel  Result Value Ref Range   Vitamin B-12 1,125 (H) 200 - 1,100 pg/mL   Folate >24.0 ng/mL  COMPLETE METABOLIC PANEL WITH GFR  Result Value Ref Range   Glucose, Bld 77 65 - 99 mg/dL   BUN 21 7 - 25 mg/dL   Creat 0.72 0.50 - 0.99 mg/dL   GFR, Est Non African American 89 > OR = 60 mL/min/1.65m2   GFR, Est African American 103 > OR = 60 mL/min/1.84m2   BUN/Creatinine Ratio NOT APPLICABLE 6 - 22 (calc)   Sodium 141 135 - 146 mmol/L   Potassium 4.2 3.5 - 5.3 mmol/L   Chloride 105 98 - 110 mmol/L   CO2 29 20 - 32 mmol/L   Calcium 9.5 8.6 - 10.4 mg/dL   Total Protein 7.2 6.1 - 8.1 g/dL   Albumin 4.6 3.6 -  5.1 g/dL   Globulin 2.6 1.9 - 3.7 g/dL (calc)   AG Ratio 1.8 1.0 - 2.5 (calc)   Total Bilirubin 0.6 0.2 - 1.2 mg/dL   Alkaline phosphatase (APISO) 70 37 - 153 U/L   AST 18 10 - 35 U/L   ALT 15 6 - 29 U/L  Magnesium  Result Value Ref Range   Magnesium 2.2 1.5 - 2.5 mg/dL       Assessment & Plan:   1. Acute pain of right knee 2. Anserine bursitis Discussed with patient that the knee is very stable, with no concerns for any major ligament injury, with the cruciate and collateral ligaments very stable on exam.  Likely has a meniscal irritation on the medial side, although doubt any big bucket-handle tear concern.  Also feel has an element of an anserine bursitis contributing to her symptoms.   Doubt any major fracture concern, although noting her history, will have a low yield to obtain an x-ray if symptoms not responding and improving as discussed.  Recommended continuing the RICE modalities, with the importance of applying cold 2-3 times a day, elevation, rest from gardening activities and the bending activities presently, and can use a neoprene type sleeve to help when up and about, and emphasized when she does get home and takes the soft, applying cold to help lessen inflammation. Can use a Tylenol product if more discomfort, and noted the benefits of an NSAID like ibuprofen or Aleve if it is more swollen and painful, although also noted the increased risks of these entities, and to use minimally and take with food if it does use. Also can use a topical Voltaren type product that is available over-the-counter to help. She noted she often tries to flex aggressively, and stretch frequently, and recommended minimizing any ballistic or aggressive type stretching, and do agree with continuing to have range of motion to help over time. She noted she has a lot of steps in her house, and trying to minimize steps would be helpful in the short-term.  Recommended following up if symptoms not improving or more problematic over time, and likely will need imaging at that point as we discussed. Await her response to the above. When she inquired, I also noted if her symptoms in the distal lower extremities are becoming more problematic over time despite the conservative measures recommended by vascular previously, and she notes she does have compression stockings she sometimes uses, may need to return to vascular to inquire about any potential further studies.       Towanda Malkin, MD 03/07/20 8:37 AM

## 2020-03-22 ENCOUNTER — Other Ambulatory Visit: Payer: Self-pay

## 2020-03-22 ENCOUNTER — Ambulatory Visit
Admission: RE | Admit: 2020-03-22 | Discharge: 2020-03-22 | Disposition: A | Discharge status: Home / Self Care | Payer: 59 | Source: Ambulatory Visit | Attending: Neurology | Admitting: Neurology

## 2020-03-22 DIAGNOSIS — M810 Age-related osteoporosis without current pathological fracture: Secondary | ICD-10-CM | POA: Diagnosis present

## 2020-04-12 ENCOUNTER — Encounter: Payer: Self-pay | Admitting: Family Medicine

## 2020-04-13 ENCOUNTER — Other Ambulatory Visit: Payer: 59

## 2020-04-13 DIAGNOSIS — Z20822 Contact with and (suspected) exposure to covid-19: Secondary | ICD-10-CM

## 2020-04-15 LAB — NOVEL CORONAVIRUS, NAA: SARS-CoV-2, NAA: NOT DETECTED

## 2020-04-15 LAB — SARS-COV-2, NAA 2 DAY TAT

## 2020-05-06 ENCOUNTER — Emergency Department
Admission: EM | Admit: 2020-05-06 | Discharge: 2020-05-06 | Disposition: A | Discharge status: Home / Self Care | Payer: 59 | Attending: Emergency Medicine | Admitting: Emergency Medicine

## 2020-05-06 ENCOUNTER — Encounter: Payer: Self-pay | Admitting: Emergency Medicine

## 2020-05-06 ENCOUNTER — Emergency Department: Payer: 59

## 2020-05-06 ENCOUNTER — Other Ambulatory Visit: Payer: Self-pay

## 2020-05-06 DIAGNOSIS — R42 Dizziness and giddiness: Secondary | ICD-10-CM | POA: Diagnosis not present

## 2020-05-06 LAB — BASIC METABOLIC PANEL
Anion gap: 11 (ref 5–15)
BUN: 24 mg/dL — ABNORMAL HIGH (ref 8–23)
CO2: 26 mmol/L (ref 22–32)
Calcium: 9.9 mg/dL (ref 8.9–10.3)
Chloride: 104 mmol/L (ref 98–111)
Creatinine, Ser: 0.75 mg/dL (ref 0.44–1.00)
GFR, Estimated: >60 mL/min (ref >60)
Glucose, Bld: 100 mg/dL — ABNORMAL HIGH (ref 70–99)
Potassium: 3.9 mmol/L (ref 3.5–5.1)
Sodium: 141 mmol/L (ref 135–145)

## 2020-05-06 LAB — URINALYSIS, COMPLETE (UACMP) WITH MICROSCOPIC
Bacteria, UA: NONE SEEN
Bilirubin Urine: NEGATIVE
Glucose, UA: NEGATIVE mg/dL
Hgb urine dipstick: NEGATIVE
Ketones, ur: NEGATIVE mg/dL
Leukocytes,Ua: NEGATIVE
Nitrite: NEGATIVE
Protein, ur: NEGATIVE mg/dL
Specific Gravity, Urine: 1.019 (ref 1.005–1.030)
pH: 6 (ref 5.0–8.0)

## 2020-05-06 LAB — CBC
HCT: 41.6 % (ref 36.0–46.0)
Hemoglobin: 14.2 g/dL (ref 12.0–15.0)
MCH: 33.4 pg (ref 26.0–34.0)
MCHC: 34.1 g/dL (ref 30.0–36.0)
MCV: 97.9 fL (ref 80.0–100.0)
Platelets: 195 10*3/uL (ref 150–400)
RBC: 4.25 MIL/uL (ref 3.87–5.11)
RDW: 12.4 % (ref 11.5–15.5)
WBC: 5.5 10*3/uL (ref 4.0–10.5)
nRBC: 0 % (ref 0.0–0.2)

## 2020-05-06 MED ORDER — MECLIZINE HCL 12.5 MG PO TABS: 12.5000 mg | ORAL_TABLET | Freq: Three times a day (TID) | ORAL | 0 refills | Status: DC | PRN

## 2020-05-06 NOTE — ED Triage Notes (Signed)
Pt to ER with c/o waking up dizzy yesterday and today.  States yesterday it got better quickly but today lingered a while and made her nauseous.  Pt states was seen at [facility] and they suggested she come here for a more involved evaluation.

## 2020-05-06 NOTE — ED Provider Notes (Signed)
[facility] Emergency Department Provider Note   ____________________________________________   First MD Initiated Contact with Patient 05/06/20 1849     (approximate)  I have reviewed the triage vital signs and the nursing notes.   HISTORY  Chief Complaint Dizziness    HPI Kristina Christian is a 64 y.o. female history of varicose veins bronchitis  Patient reports that for the last 2 days when she is woke up in the morning she has felt "dizzy"  She reports that it feels like a spinning or off-balance sensation tends to go away after a brief period this morning and seem to last for about an hour or 2 and she felt like she had to use her hands to guide her and grab things because of feeling of dizziness  No headache no upper respiratory symptoms no sinus congestion.  No neck pain.  No chest pain or shortness of breath or recent illness.  She was concerned as could be vertigo  She went to urgent care and was referred to the ER for further evaluation  Specifically denies any speech changes no numbness tingling or weakness, no fevers or chills.  No chest pain or trouble breathing.   Past Medical History:  Diagnosis Date  . Acute upper respiratory infection   . Allergy   . Chronic bronchitis ([place]) 06/11/2017  . Fibrocystic breast   . Osteoporosis   . Sebaceous cyst   . Synovial cyst   . Varicose veins of lower extremity     Patient Active Problem List   Diagnosis Date Noted  . Anserine bursitis 03/07/2020  . Venous stasis dermatitis of right lower extremity 10/06/2019  . Preventative health care 05/05/2018  . Spider vein of lower extremity 01/06/2018  . Chronic bronchitis with acute exacerbation ([place]) 06/11/2017  . Chronic bronchitis ([place]) 06/11/2017  . 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 Atopic dermatitis 09/19/2015  . Nodule of finger of both hands 09/19/2015  . 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 Infected sebaceous cyst of skin 03/22/2015  .  Sun-induced skin changes, keratosis 03/22/2015  . LBP (low back pain) 01/27/2015  . Osteoporosis 01/27/2015  . Varicose veins of leg with pain, bilateral 01/27/2015  . Allergic rhinitis, seasonal 01/27/2015  . RAD (reactive airway disease) 01/27/2015    Past Surgical History:  Procedure Laterality Date  . COLONOSCOPY WITH PROPOFOL N/A 05/20/2018   Procedure: COLONOSCOPY WITH PROPOFOL;  Surgeon: Jonathon Bellows, MD;  Location: [facility] ENDOSCOPY;  Service: Gastroenterology;  Laterality: N/A;  . TONSILLECTOMY AND ADENOIDECTOMY  04/22/2007    Prior to Admission medications   Medication Sig Start Date End Date Taking? Authorizing Provider  albuterol (PROVENTIL HFA;VENTOLIN HFA) 108 (90 Base) MCG/ACT inhaler Inhale 2 puffs every 4 (four) hours as needed into the lungs for wheezing or shortness of breath. 06/07/17   Arnetha Courser, MD  b complex vitamins capsule Take 1 capsule by mouth daily.    [provider]  calcium carbonate (OS-CAL) 600 MG TABS tablet Take 500 mg by mouth 2 (two) times daily.     [provider]  cholecalciferol (VITAMIN D) 1000 units tablet Take 1,000 Units by mouth daily.    [provider]  fluticasone (FLONASE) 50 MCG/ACT nasal spray Place 2 sprays as needed into both nostrils. 06/07/17   Arnetha Courser, MD  levocetirizine (XYZAL ALLERGY 24HR) 5 MG tablet Take 5 mg by mouth as needed for allergies.    [provider]  Magnesium Citrate 100 MG TABS Take 1  tablet by mouth daily.    [provider]  meclizine (ANTIVERT) 12.5 MG tablet Take 1 tablet (12.5 mg total) by mouth 3 (three) times daily as needed for dizziness or nausea. 05/06/20   Delman Kitten, MD  MULTIPLE VITAMINS PO Take daily by mouth.     [provider]  vitamin C (ASCORBIC ACID) 500 MG tablet Take 500 mg by mouth daily.    [provider]  zinc gluconate 50 MG tablet Take 50 mg by mouth daily.    [provider]    Allergies Breo ellipta  [fluticasone furoate-vilanterol]  Family History  Problem Relation Age of Onset  . Von Willebrand disease Mother   . Prostate cancer Brother   . Bladder Cancer Maternal Grandfather     Social History Social History   Tobacco Use  . Smoking status: Never Smoker  . Smokeless tobacco: Never Used  Vaping Use  . Vaping Use: Never used  Substance Use Topics  . Alcohol use: No    Alcohol/week: 0.0 standard drinks  . Drug use: No    Review of Systems Constitutional: No fever/chills Eyes: No visual changes. ENT: No sore throat. Cardiovascular: Denies chest pain. Respiratory: Denies shortness of breath. Gastrointestinal: No abdominal pain.   Genitourinary: Negative for dysuria. Musculoskeletal: Negative for back pain. Skin: Negative for rash. Neurological: Negative for headaches, areas of focal weakness or numbness.  See HPI.   Her dizziness seems to be improved now. ____________________________________________   PHYSICAL EXAM:  VITAL SIGNS: ED Triage Vitals  Enc Vitals Group     BP 05/06/20 1822 (!) 130/57     Pulse Rate 05/06/20 1822 75     Resp 05/06/20 1822 18     Temp 05/06/20 1822 98.5 F (36.9 C)     Temp src --      SpO2 05/06/20 1822 98 %     Weight 05/06/20 1823 94 lb (42.6 kg)     Height 05/06/20 1823 5\' 4"  (1.626 m)     Head Circumference --      Peak Flow --      Pain Score 05/06/20 1823 0     Pain Loc --      Pain Edu? --      Excl. in [place]? --     Constitutional: Alert and oriented. Well appearing and in no acute distress. Eyes: Conjunctivae are normal. Head: Atraumatic. Nose: No congestion/rhinnorhea. Mouth/Throat: Mucous membranes are moist. Neck: No stridor.  Cardiovascular: Normal rate, regular rhythm. Grossly normal heart sounds.  Good peripheral circulation. Respiratory: Normal respiratory effort.  No retractions. Lungs CTAB. Gastrointestinal: Soft and nontender. No distention. Musculoskeletal: No lower extremity tenderness nor  edema. Neurologic:  Normal speech and language. No gross focal neurologic deficits are appreciated.  Normal cranial nerve exam.  Normal extraocular movements.  No ataxia in the extremities.  Full strength in all extremities.  No pronator drift in any extremity.  Normal neurosensory exam except for slight decreased sensation over the toes and feet bilaterally which patient reports is somewhat chronic. Skin:  Skin is warm, dry and intact. No rash noted.  Multiple varicose veins chronic per patient. Psychiatric: Mood and affect are normal. Speech and behavior are normal.  ____________________________________________   LABS (all labs ordered are listed, but only abnormal results are displayed)  Labs Reviewed  BASIC METABOLIC PANEL - Abnormal; Notable for the following components:      Result Value   Glucose, Bld 100 (*)    BUN 24 (*)  All other components within normal limits  URINALYSIS, COMPLETE (UACMP) WITH MICROSCOPIC - Abnormal; Notable for the following components:   Color, Urine AMBER (*)    APPearance CLOUDY (*)    All other components within normal limits  CBC  CBG MONITORING, ED   ____________________________________________  EKG  Reviewed interpreted 1835 Heart rate 70 QRS 80 QTc 440 Normal sinus rhythm, no evidence of acute ischemia or ectopy denoted ____________________________________________  RADIOLOGY     I believe that MRI would be the preferred study over CT scan giving much better clinical utility to evaluate and exclude acute stroke which is on her differential  ____________________________________________   PROCEDURES  Procedure(s) performed: None  Procedures  Critical Care performed: No  ____________________________________________   INITIAL IMPRESSION / ASSESSMENT AND PLAN / ED COURSE  Pertinent labs & imaging results that were available during my care of the patient were reviewed by me and considered in my medical decision making (see  chart for details).   Patient was for evaluation of dizziness appears to be primarily upon rising described as a spinning sensation.  Tympanic membranes and ears appear normal bilaterally without upper respiratory or sinus symptoms.  Normal hearing.  I suspect this is likely peripheral vertigo, but given the patient's age we will proceed with MRI of the brain to evaluate and exclude acute stroke specifically.  Also seek for central etiologies.  I believe that MRI would be the preferred study over CT scan giving much better clinical utility to evaluate and exclude acute stroke which is on her differential          ____________________________________________   FINAL CLINICAL IMPRESSION(S) / ED DIAGNOSES  Final diagnoses:  Vertigo        Note:  This document was prepared using Dragon voice recognition software and may include unintentional dictation errors       Delman Kitten, MD 05/06/20 2030

## 2020-05-06 NOTE — ED Provider Notes (Signed)
Reviewed MRI with patient and her family at bedside, reassuring result no evidence of stroke.  She will follow-up with Dr. Tami Ribas her ear nose and throat physician regarding concerns of vertigo.  Careful return precautions recommended.  Discussed side effects, careful use of meclizine with patient who is in agreement   Delman Kitten, MD 05/06/20 2043

## 2020-06-02 ENCOUNTER — Other Ambulatory Visit: Payer: 59

## 2020-08-05 ENCOUNTER — Other Ambulatory Visit: Payer: Self-pay

## 2020-08-05 ENCOUNTER — Other Ambulatory Visit: Payer: 59

## 2020-08-05 DIAGNOSIS — Z20822 Contact with and (suspected) exposure to covid-19: Secondary | ICD-10-CM

## 2020-08-07 LAB — SARS-COV-2, NAA 2 DAY TAT

## 2020-08-07 LAB — NOVEL CORONAVIRUS, NAA: SARS-CoV-2, NAA: NOT DETECTED

## 2020-08-22 ENCOUNTER — Ambulatory Visit: Payer: 59 | Attending: Internal Medicine

## 2020-08-22 DIAGNOSIS — Z23 Encounter for immunization: Secondary | ICD-10-CM

## 2020-08-22 NOTE — Progress Notes (Signed)
   Covid-19 Vaccination Clinic  Name:  Kristina Christian    MRN: [id number] DOB: Dec 12, 1955  08/22/2020  [name] was observed post Covid-19 immunization for 15 minutes without incident. She was provided with Vaccine Information Sheet and instruction to access the V-Safe system.   [name] was instructed to call 911 with any severe reactions post vaccine: Marland Kitchen Difficulty breathing  . Swelling of face and throat  . A fast heartbeat  . A bad rash all over body  . Dizziness and weakness   Immunizations Administered    Name Date Dose VIS Date Route   PFIZER Comrnaty(Gray TOP) Covid-19 Vaccine 08/22/2020  1:17 PM 0.3 mL 06/30/2020 Intramuscular   Manufacturer: [place]   Lot: [id number]   [place]: [phone]

## 2020-11-21 ENCOUNTER — Other Ambulatory Visit: Payer: Self-pay

## 2020-11-21 ENCOUNTER — Encounter: Payer: Self-pay | Admitting: Family Medicine

## 2020-11-21 ENCOUNTER — Ambulatory Visit (INDEPENDENT_AMBULATORY_CARE_PROVIDER_SITE_OTHER): Payer: 59 | Admitting: Family Medicine

## 2020-11-21 VITALS — BP 134/84 | HR 74 | Temp 98.0°F | Resp 16 | Ht 64.0 in | Wt 95.2 lb

## 2020-11-21 DIAGNOSIS — I8393 Asymptomatic varicose veins of bilateral lower extremities: Secondary | ICD-10-CM | POA: Diagnosis not present

## 2020-11-21 DIAGNOSIS — R001 Bradycardia, unspecified: Secondary | ICD-10-CM | POA: Diagnosis not present

## 2020-11-21 DIAGNOSIS — L819 Disorder of pigmentation, unspecified: Secondary | ICD-10-CM

## 2020-11-21 DIAGNOSIS — M810 Age-related osteoporosis without current pathological fracture: Secondary | ICD-10-CM | POA: Diagnosis not present

## 2020-11-21 DIAGNOSIS — I8390 Asymptomatic varicose veins of unspecified lower extremity: Secondary | ICD-10-CM

## 2020-11-21 MED ORDER — ALENDRONATE SODIUM 70 MG PO TABS: 70.0000 mg | ORAL_TABLET | ORAL | 11 refills | Status: DC

## 2020-11-21 NOTE — Patient Instructions (Signed)
Preventing Osteoporosis, Adult Osteoporosis is a condition that causes the bones to lose density. This means that the bones become thinner, and the normal spaces in bone tissue become larger. Low bone density can make the bones weak and cause them to break more easily. Osteoporosis cannot always be prevented, but you can take steps to lower your risk of developing this condition. How can this condition affect me? If you develop osteoporosis, you will be more likely to break bones in your wrist, spine, or hip. Even a minor accident or injury can be enough to break weak bones. The bones will also be slower to heal. Osteoporosis can cause other problems as well, such as a stooped posture or trouble with movement. Osteoporosis can occur with aging. As you get older, you may lose bone tissue more quickly, or it may be replaced more slowly. Osteoporosis is more likely to develop if you have poor nutrition or do not get enough calcium or vitamin D. Other lifestyle factors can also play a role. By eating a well-balanced diet and making lifestyle changes, you can help keep your bones strong and healthy, lowering your chances of developing osteoporosis. What can increase my risk? The following factors may make you more likely to develop osteoporosis:  Having a family history of the condition.  Having poor nutrition or not getting enough calcium or vitamin D.  Using certain medicines, such as steroid medicines or anti-seizure medicines.  Being any of the following: ? 50 years of age or older. ? Female. ? A woman who has gone through menopause (is postmenopausal). ? A person who is of European or Asian descent.  Using products that contain nicotine or tobacco, such as cigarettes, e-cigarettes, and chewing tobacco.  Not being physically active (being sedentary).  Having a small body frame. What actions can I take to prevent this? Get enough calcium  Make sure you get enough calcium every day. Calcium  is the most important mineral for bone health. Most people can get enough calcium from their diet, but supplements may be recommended for people who are at risk for osteoporosis. Follow these guidelines: ? If you are age 50 or younger, aim to get 1,000 milligrams (mg) of calcium every day. ? If you are older than age 50, aim to get 1,200 mg of calcium every day.  Good sources of calcium include: ? Dairy products, such as low-fat or nonfat milk, cheese, and yogurt. ? Dark green leafy vegetables, such as bok choy and broccoli. ? Foods that have had calcium added to them (calcium-fortified foods), such as orange juice, cereal, bread, soy beverages, and tofu products. ? Nuts, such as almonds.  Check nutrition labels to see how much calcium is in a food or drink.   Get enough vitamin D  Try to get enough vitamin D every day. Vitamin D is the most essential vitamin for bone health. It helps the body absorb calcium. Follow these guidelines for how much vitamin D to get from food: ? If you are age 70 or younger, aim to get at least 600 international units (IU) every day. Your health care provider may suggest more. ? If you are older than age 70, aim to get at least 800 international units every day. Your health care provider may suggest more.  Good sources of vitamin D in your diet include: ? Egg yolks. ? Oily fish, such as salmon, sardines, and tuna. ? Milk and cereal fortified with vitamin D.  Your body also makes vitamin   D when you are out in the sun. Exposing the bare skin on your face, arms, legs, or back to the sun for no more than 30 minutes a day, 2 times a week is more than enough. Beyond that, make sure you use sunblock to protect your skin from sunburn, which increases your risk for skin cancer. Exercise  Stay active and get exercise every day.  Ask your health care provider what types of exercise are best for you. Weight-bearing and strength-building activities are important for  building and maintaining healthy bones. Some examples of these types of activities include: ? Walking and hiking. ? Jogging and running. ? Dancing. ? Gym exercises and lifting weights. ? Tennis and racquetball. ? Climbing stairs. ? Tai chi.   Make other lifestyle changes  Do not use any products that contain nicotine or tobacco, such as cigarettes, e-cigarettes, and chewing tobacco. If you need help quitting, ask your health care provider.  Lose weight if you are overweight.  If you drink alcohol: ? Limit how much you use to:  0-1 drink a day for women who are not pregnant.  0-2 drinks a day for men. ? Be aware of how much alcohol is in your drink. In the [place] one drink equals one 12 oz bottle of beer (355 mL), one 5 oz glass of wine (148 mL), or one 1 oz glass of hard liquor (44 mL). Where to find support If you need help making changes to prevent osteoporosis, talk with your health care provider. You can ask for a referral to a dietitian and a physical therapist. Where to find more information Learn more about osteoporosis from:  NIH Osteoporosis and Related Bone Diseases National Resource Center: www.bones.nih.gov  U.S. Office on Women's Health: www.womenshealth.gov  National Osteoporosis Foundation: www.nof.org Summary  Osteoporosis is a condition that causes weak bones that are more likely to break.  Eat a healthy diet, making sure you get enough calcium and vitamin D, and stay active by getting regular exercise to help prevent osteoporosis.  Other ways to reduce your risk of osteoporosis include maintaining a healthy weight and avoiding alcohol and products that contain nicotine or tobacco. This information is not intended to replace advice given to you by your health care provider. Make sure you discuss any questions you have with your health care provider. Document Revised: 12/24/2019 Document Reviewed: 12/24/2019 Elsevier Patient Education  2021 Elsevier Inc.  

## 2020-11-21 NOTE — Progress Notes (Signed)
Patient ID: Kristina Christian, female    DOB: 15-Nov-1955, 65 y.o.   MRN: [id number]  PCP: Delsa Grana, PA-C  Chief Complaint  Patient presents with  . leg cramps     Bilateral and skin discoloration    Subjective:   Kristina Christian is a 65 y.o. female, presents to clinic with CC of the following:  HPI   F/up on dexa results - osteoporosis was slightly improved  Completed f/up dexa last August 2021 -  Was on fosamax 2020 -  Narrative & Impression  EXAM: DUAL X-RAY ABSORPTIOMETRY (DXA) FOR BONE MINERAL DENSITY  IMPRESSION: Your patient Kristina Christian completed a BMD test on 03/22/2020 using the [place] (software version: 14.10) manufactured by [employer]. The following summarizes the results of our evaluation. Technologist: [facility] PATIENT BIOGRAPHICAL: Name: Kristina Christian, Kristina Christian Patient ID: [id number] Birth Date: 1956-05-19 Height: 64.0 in. Gender: Female Exam Date: 03/22/2020 Weight: 95.0 lbs. Indications: Caucasian, Low Body Weight, Postmenopausal Fractures: Treatments: Vitamin D, Albuterol, Calcium, Multi-Vitamin DENSITOMETRY RESULTS: Site      Region     Measured Date Measured Age WHO Classification Young Adult T-score BMD         %Change vs. Previous Significant Change (*) AP Spine L1-L4 03/22/2020 63.9 Osteoporosis -2.5 0.889 g/cm2 4.5% Yes AP Spine L1-L4 03/17/2018 61.9 Osteoporosis -2.8 0.851 g/cm2 -8.5% Yes AP Spine L1-L4 03/09/2013 56.9 Osteopenia -2.1 0.930 g/cm2 - -  DualFemur Total Left 03/22/2020 63.9 Osteopenia -1.8 0.776 g/cm2 2.2% - DualFemur Total Left 03/17/2018 61.9 Osteopenia -2.0 0.759 g/cm2 -6.3% Yes DualFemur Total Left 03/09/2013 56.9 Osteopenia -1.6 0.810 g/cm2 - -  DualFemur Total Mean 03/22/2020 63.9 Osteopenia -1.8 0.777 g/cm2 1.2% - DualFemur Total Mean 03/17/2018 61.9 Osteopenia -1.9 0.768 g/cm2 -6.7% Yes DualFemur Total Mean 03/09/2013 56.9 Osteopenia -1.5 0.823 g/cm2 - - ASSESSMENT: The BMD measured at AP  Spine L1-L4 is 0.889 g/cm2 with a T-score of -2.5. This patient is considered osteoporotic according to [facility]) criteria. The scan quality is good. Compared with prior study, there has been significant increase in the spine. Compared with prior study, there has been no significant change in the total hip. [facility]) criteria for post-menopausal, Caucasian Women: Normal:                   T-score at or above -1 SD Osteopenia/low bone mass: T-score between -1 and -2.5 SD Osteoporosis:             T-score at or below -2.5 SD  RECOMMENDATIONS: 1. All patients should optimize calcium and vitamin D intake. 2. Consider FDA-approved medical therapies in postmenopausal women and men aged 30 years and older, based on the following: a. A hip or vertebral(clinical or morphometric) fracture b. T-score < -2.5 at the femoral neck or spine after appropriate evaluation to exclude secondary causes c. Low bone mass (T-score between -1.0 and -2.5 at the femoral neck or spine) and a 10-year probability of a hip fracture > 3% or a 10-year probability of a major osteoporosis-related fracture > 20% based on the [place]-adapted WHO algorithm 3. Clinician judgment and/or patient preferences may indicate treatment for people with 10-year fracture probabilities above or below these levels FOLLOW-UP: People with diagnosed cases of osteoporosis or at high risk for fracture should have regular bone mineral density tests. For patients eligible for Medicare, routine testing is allowed once every 2 years. The testing frequency can be increased to one year for patients who have  rapidly progressing disease, those who are receiving or discontinuing medical therapy to restore bone mass, or have additional risk factors.  I have reviewed this report, and agree with the above findings.  [facility]   Electronically Signed   By: Rolm Baptise M.D.   On:  03/22/2020 11:31    On supplements, not on bisphosphonate's - prescribed them once in the past by Dr. Sanda Klein in 2019    Returns with complaints of leg pain and changes - presented several times in the past for the same:  09/2019: . Leg cramping    had discussed with Dr. Sanda Klein previously, cramping getting worse, would like to see Vascular      Previously discussed in 2019 with Dr. Sanda Klein her past PCP  June cramps of b/l lower extremities: Cramps of lower extremity    will check Mg2+, K+, Ca2+; suggested tonic water; continue work-up if nothing obvious   Relevant Orders   Magnesium   Labs were normal and Dr. Sanda Klein advised trying tonic water - no improvement with that either Pt was told she would be referred to vascular specialist, but unfortunately with covid pandemic and PCP retiring pt as lost to f/up and I do not see any referrals put in or pt appt or calls - but she has had a very difficult time getting through the phones.  Pt reports current and prior b/l leg cramping and spasming, she will have toes, legs, arch of foot, calves all spasm, contract, cramp- frequent daily muscle spasms triggered with small movementof her legs or feet, symptoms are more severe at night - often 2-3 x a night she will fly out of bed.  She reports family hx of "bad circulation" and she also reports gradual onset of veins to LE's worse in ankles and worsening and progressive moving up her legs a few inches over the past 1-2 years.  She is on B-complex and magnesium supplements  She denies any LE rash, swelling, wounds sores, skin or hair changes, denies claudications sx.     June 2021: Chief Complaint  Patient presents with  . Follow-up    from Dr Lucky Cowboy vascular  . Cyst    on tailbone    Subjective:   Kristina Christian is a 65 y.o. female, presents to clinic with CC of the following:  HPI  Pt complains of continued years of leg cramps On Vit D Calcium Mag 100 mg Vit C, zinc B  complex  Leg cramps wake her up every night   She went to vascular for eval: He did not recommend any intervention at this time.  Encounter note reviewed: "Venous stasis dermatitis of right lower extremity She does have some venous stasis changes on the right leg with her very mild reflux. This would not be severe enough to warrant a laser ablation at this time. Recommend compression, elevation, and continued activity. Could be reassessed in the future if this worsens.  Pt finished olendronate in Sept 2020, last dexa 2019 Pt overdue for mammogram, painful for her and she doesn't want to do the screening  Assessment & Plan:      ICD-10-CM   1. Age-related osteoporosis without current pathological fracture  M81.0 DG Bone Density   She wanted to recheck her bone scan right now but explained that she needs to repeat in about 2 years from last   2. Paresthesia of both lower extremities  R20.2 TSH    T4, free    Iron, TIBC and Ferritin  Panel    CBC (INCLUDES DIFF/PLT) WITH PATHOLOGIST REVIEW    B12 and Folate Panel    COMPLETE METABOLIC PANEL WITH GFR    Magnesium   continued frustration re years of LE symptoms - offered additional labs to r/o other possible causes  3. Leg cramps  R25.2 TSH    T4, free    Iron, TIBC and Ferritin Panel    CBC (INCLUDES DIFF/PLT) WITH PATHOLOGIST REVIEW    B12 and Folate Panel    COMPLETE METABOLIC PANEL WITH GFR    Magnesium   She is on supplements, will recheck electrolytes, magnesium  4. RLS (restless legs syndrome)  G25.81    offered her meds for RLS - she refused Rx     Explained to the patient that we may not always find lab abnormalities to explain patient's symptoms and unfortunately medicine is not an Chief Strategy Officer.  I offered other referrals and medications to her but she refused. I explained that the labs may be expensive or poorly covered but she did want to proceed.  Leg pain could possibly have a  low back etiology?  Even if labs are normal she may choose to add additional supplements to see if they help, this would be an option as long as supplements would not pose a risk of harm  Did discuss and offer gabapentin or Lyrica for her nighttime leg symptoms but she tries to be very naturopathic and very hesitant with prescription medications She was encouraged to call and get her mammogram scheduled and health maintenance list was given to her with contact information for [place] [facility] she is very hesitant about getting her mammogram done has been in quite some time.  She has pain with her mammogram screening she has several complaints about how they were done in the past.  I encouraged her to call and inquire about technologist in her express her concerns and see if they have any when that would be very gentle and able to help her get her mammogram screening done  Delsa Grana, PA-C 12/23/19 9:05 AM   She is concerned with her low heart rate -normal today, no passing out episodes, exertional dyspnea, near syncope, chest pain She has always had a low HR Pulse Readings from Last 3 Encounters:  11/21/20 74  05/06/20 (!) 58  03/07/20 67      Patient Active Problem List   Diagnosis Date Noted  . Anserine bursitis 03/07/2020  . Venous stasis dermatitis of right lower extremity 10/06/2019  . Preventative health care 05/05/2018  . Spider vein of lower extremity 01/06/2018  . Chronic bronchitis with acute exacerbation ([place]) 06/11/2017  . Chronic bronchitis ([place]) 06/11/2017  . 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 Atopic dermatitis 09/19/2015  . Nodule of finger of both hands 09/19/2015  . 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 Infected sebaceous cyst of skin 03/22/2015  . Sun-induced skin changes, keratosis 03/22/2015  . LBP (low back pain) 01/27/2015  . Osteoporosis 01/27/2015  . Varicose veins of leg with pain, bilateral 01/27/2015  . Allergic rhinitis, seasonal  01/27/2015  . RAD (reactive airway disease) 01/27/2015      Current Outpatient Medications:  .  albuterol (PROVENTIL HFA;VENTOLIN HFA) 108 (90 Base) MCG/ACT inhaler, Inhale 2 puffs every 4 (four) hours as needed into the lungs for wheezing or shortness of breath., Disp: 1 Inhaler, Rfl: 1 .  b complex vitamins capsule, Take 1 capsule by mouth daily., Disp: , Rfl:  .  calcium  carbonate (OS-CAL) 600 MG TABS tablet, Take 500 mg by mouth 2 (two) times daily. , Disp: , Rfl:  .  cholecalciferol (VITAMIN D) 1000 units tablet, Take 1,000 Units by mouth daily., Disp: , Rfl:  .  fluticasone (FLONASE) 50 MCG/ACT nasal spray, Place 2 sprays as needed into both nostrils., Disp: 16 g, Rfl: 11 .  levocetirizine (XYZAL) 5 MG tablet, Take 5 mg by mouth as needed for allergies., Disp: , Rfl:  .  Magnesium Citrate 100 MG TABS, Take 1 tablet by mouth daily., Disp: , Rfl:  .  MULTIPLE VITAMINS PO, Take daily by mouth. , Disp: , Rfl:  .  vitamin C (ASCORBIC ACID) 500 MG tablet, Take 500 mg by mouth daily., Disp: , Rfl:  .  zinc gluconate 50 MG tablet, Take 50 mg by mouth daily., Disp: , Rfl:    Allergies  Allergen Reactions  . Breo Ellipta [Fluticasone Furoate-Vilanterol]      Social History   Tobacco Use  . Smoking status: Never Smoker  . Smokeless tobacco: Never Used  Vaping Use  . Vaping Use: Never used  Substance Use Topics  . Alcohol use: No    Alcohol/week: 0.0 standard drinks  . Drug use: No      Chart Review Today: I personally reviewed active problem list, medication list, allergies, family history, social history, health maintenance, notes from last encounter, lab results, imaging with the patient/caregiver today.   Review of Systems  Constitutional: Negative.   HENT: Negative.   Eyes: Negative.   Respiratory: Negative.   Cardiovascular: Negative.   Gastrointestinal: Negative.   Endocrine: Negative.   Genitourinary: Negative.   Musculoskeletal: Negative.   Skin: Negative.    Allergic/Immunologic: Negative.   Neurological: Negative.   Hematological: Negative.   Psychiatric/Behavioral: Negative.   All other systems reviewed and are negative.      Objective:   Vitals:   11/21/20 1542  BP: 134/84  Pulse: 74  Resp: 16  Temp: 98 F (36.7 C)  SpO2: 98%  Weight: 95 lb 3.2 oz (43.2 kg)  Height: 5\' 4"  (1.626 m)    Body mass index is 16.34 kg/m.  Physical Exam Vitals and nursing note reviewed.  Constitutional:      General: She is not in acute distress.    Appearance: Normal appearance. She is well-developed. She is not ill-appearing, toxic-appearing or diaphoretic.     Interventions: Face mask in place.  HENT:     Head: Normocephalic and atraumatic.     Right Ear: External ear normal.     Left Ear: External ear normal.  Eyes:     General: Lids are normal. No scleral icterus.       Right eye: No discharge.        Left eye: No discharge.     Conjunctiva/sclera: Conjunctivae normal.  Neck:     Trachea: Phonation normal. No tracheal deviation.  Cardiovascular:     Rate and Rhythm: Normal rate and regular rhythm.     Pulses: Normal pulses.          Radial pulses are 2+ on the right side and 2+ on the left side.       Posterior tibial pulses are 2+ on the right side and 2+ on the left side.     Heart sounds: Normal heart sounds. No murmur heard. No friction rub. No gallop.   Pulmonary:     Effort: Pulmonary effort is normal. No respiratory distress.     Breath sounds:  Normal breath sounds. No stridor. No wheezing, rhonchi or rales.  Chest:     Chest wall: No tenderness.  Abdominal:     General: Bowel sounds are normal. There is no distension.     Palpations: Abdomen is soft.  Musculoskeletal:     Right lower leg: No edema.     Left lower leg: No edema.  Skin:    General: Skin is warm and dry.     Coloration: Skin is not jaundiced or pale.     Findings: No rash.  Neurological:     Mental Status: She is alert.     Motor: No abnormal  muscle tone.     Gait: Gait normal.  Psychiatric:        Mood and Affect: Mood normal.        Speech: Speech normal.        Behavior: Behavior normal.      Results for orders placed or performed in visit on 08/05/20  Novel Coronavirus, NAA ([facility])   Specimen: Nasopharyngeal(NP) swabs in vial transport medium   Nasopharynge  Screenin  Result Value Ref Range   SARS-CoV-2, NAA Not Detected Not Detected  SARS-COV-2, NAA 2 DAY TAT   Nasopharynge  Screenin  Result Value Ref Range   SARS-CoV-2, NAA 2 DAY TAT Performed        Assessment & Plan:      ICD-10-CM   1. Age-related osteoporosis without current pathological fracture  M81.0 alendronate (FOSAMAX) 70 MG tablet   Reviewed bone density results today, continue supplements and Fosamax, DEXA repeat in 2 years  2. Bradycardia  R00.1    Concern for bradycardia however heart rate is normal today she is asymptomatic do not see any reason to do EKG, discussed concerning symptoms  3. Spider vein of lower extremity  I83.90 Ambulatory referral to Vascular Surgery   She would like to see [facility] vascular specialist for second opinion again continued concerns reviewed her past results and reviewed common symptoms  4. Varicose veins of both lower extremities, unspecified whether complicated  [id number] Ambulatory referral to Vascular Surgery   Likely uncomplicated no wounds, chronic skin changes and reticular veins, mild achy symptoms and mild swelling  5. Hyperpigmentation  L81.9 Ambulatory referral to Vascular Surgery   Likely due to derma stasis, again reviewed conservative management, offered second referral, she should do compression stockings and may do cosmetic treatment?   If second vascular referral does not answer her questions I explained to her that she could possibly see dermatology and they may do a biopsy but I suspect the underlying etiology of her hyperpigmentation does have to do with her venous insufficiency,.  I do not see  any lesions or wounds    Delsa Grana, PA-C 11/21/20 3:58 PM

## 2020-11-22 ENCOUNTER — Ambulatory Visit: Payer: 59 | Admitting: Family Medicine

## 2021-01-04 ENCOUNTER — Other Ambulatory Visit: Payer: Self-pay | Admitting: *Deleted

## 2021-01-04 DIAGNOSIS — I83813 Varicose veins of bilateral lower extremities with pain: Secondary | ICD-10-CM

## 2021-01-31 NOTE — Progress Notes (Signed)
[facility]           [facility] [place]  History and Physical   Kristina Christian is a 65 y.o. female who presents with bilateral leg cramping.    She was seen by Dr. Lucky Cowboy in [place] in March 2021 for BLE varicose veins.  At that time, she was having cramping that was waking her at night and this was worse after being on her feet for a long period of time.  The left leg was worse than the right. She did not have any reflux in the LLE and minimal reflux in the right.  She was elevating her legs and using compression socks.    She comes in today for re-evaluation.  She states that it has been a year since she was seen for her legs.  She states that she has cramping in both legs.  She states that it can happen during the day or at night.  It does not necessarily happen with walking.  She does not really have any noticeable swelling in her legs.  She has worn compression (thigh high and knee high), however these do not really help and are itchy.  She states she got some copper fit socks and they do not itch and seem to help some.  She does elevate her legs.  She does have some skin color changes on the lower legs.  She has spider veins present bilaterally.  She does have family hx of varicose veins with her mother.    She states that she does have some numbness on the right great and 2nd toes and sometimes has a blue color to the toes.    Her biggest issue is the cramping.  She has tried magnesium and it did work for a bit but continues to cramp.  She does use mustard with some relief but has continued cramping.    She has had the life line screening and her ABI, AAA and carotid screenings were all normal.    She states she has a cough and has hx of chronic bronchitis a couple times a year.  Her covid test was negative.   The pt is not on a statin for cholesterol management.  The pt is not on a daily aspirin.   Other AC:  none The pt is not on medication for hypertension.    The pt is not diabetic.   Tobacco hx:  never   Past Medical History:  Diagnosis Date   Acute upper respiratory infection    Allergy    Chronic bronchitis ([place]) 06/11/2017   Fibrocystic breast    Osteoporosis    Sebaceous cyst    Synovial cyst    Varicose veins of lower extremity     Past Surgical History:  Procedure Laterality Date   COLONOSCOPY WITH PROPOFOL N/A 05/20/2018   Procedure: COLONOSCOPY WITH PROPOFOL;  Surgeon: Jonathon Bellows, MD;  Location: [facility] ENDOSCOPY;  Service: Gastroenterology;  Laterality: N/A;   TONSILLECTOMY AND ADENOIDECTOMY  04/22/2007    Social History   Socioeconomic History   Marital status: Married    Spouse name: Gwenlyn Perking   Number of children: 2   Years of education: Not on file   Highest education level: Not on file  Occupational History   Not on file  Tobacco Use   Smoking status: Never   Smokeless tobacco: Never  Vaping Use   Vaping Use: Never used  Substance and Sexual Activity  Alcohol use: No    Alcohol/week: 0.0 standard drinks   Drug use: No   Sexual activity: Yes    Partners: Male    Birth control/protection: Post-menopausal  Other Topics Concern   Not on file  Social History Narrative   Not on file   Social Determinants of Health   Financial Resource Strain: Not on file  Food Insecurity: Not on file  Transportation Needs: Not on file  Physical Activity: Not on file  Stress: Not on file  Social Connections: Not on file  Intimate Partner Violence: Not on file    Family History  Problem Relation Age of Onset   Von Willebrand disease Mother    Prostate cancer Brother    Bladder Cancer Maternal Grandfather     Current Outpatient Medications  Medication Sig Dispense Refill   albuterol (PROVENTIL HFA;VENTOLIN HFA) 108 (90 Base) MCG/ACT inhaler Inhale 2 puffs every 4 (four) hours as needed into the lungs for wheezing or shortness of breath. 1 Inhaler 1   alendronate (FOSAMAX) 70 MG tablet Take 1 tablet (70 mg total)  by mouth every 7 (seven) days. Take with a full glass of water on an empty stomach. 4 tablet 11   b complex vitamins capsule Take 1 capsule by mouth daily.     calcium carbonate (OS-CAL) 600 MG TABS tablet Take 500 mg by mouth 2 (two) times daily.      cholecalciferol (VITAMIN D) 1000 units tablet Take 1,000 Units by mouth daily.     fluticasone (FLONASE) 50 MCG/ACT nasal spray Place 2 sprays as needed into both nostrils. 16 g 11   levocetirizine (XYZAL) 5 MG tablet Take 5 mg by mouth as needed for allergies.     Magnesium Citrate 100 MG TABS Take 1 tablet by mouth daily.     MULTIPLE VITAMINS PO Take daily by mouth.      vitamin C (ASCORBIC ACID) 500 MG tablet Take 500 mg by mouth daily.     zinc gluconate 50 MG tablet Take 50 mg by mouth daily.     No current facility-administered medications for this visit.    Allergies  Allergen Reactions   Breo Ellipta [Fluticasone Furoate-Vilanterol]     REVIEW OF SYSTEMS:   [X]  denotes positive finding, [ ]  denotes negative finding Cardiac  Comments:  Chest pain or chest pressure:    Shortness of breath upon exertion:    Short of breath when lying flat:    Irregular heart rhythm: x       Vascular    Pain in calf, thigh, or hip brought on by ambulation:    Cramps bilateral legs x   Blood clot in your veins:    Leg swelling:         Pulmonary    Oxygen at home:    Productive cough:     Wheezing:         Neurologic    Sudden weakness in arms or legs:     Sudden numbness in arms or legs:     Sudden onset of difficulty speaking or slurred speech:    Temporary loss of vision in one eye:     Problems with dizziness:         Gastrointestinal    Blood in stool:     Vomited blood:         Genitourinary    Burning when urinating:     Blood in urine:        Psychiatric  Major depression:         Hematologic    Bleeding problems:    Problems with blood clotting too easily:        Skin    Rashes or ulcers:         Constitutional    Fever or chills:      PHYSICAL EXAMINATION:  Today's Vitals   02/02/21 0955  BP: 120/70  Pulse: 71  Resp: 20  Temp: 97.7 F (36.5 C)  TempSrc: Temporal  SpO2: 100%  Weight: 92 lb 8 oz (42 kg)  Height: 5\' 4"  (1.626 m)  PainSc: 8   PainLoc: Leg   Body mass index is 15.88 kg/m.   General:  WDWN in NAD; vital signs documented above Gait: Not observed HENT: WNL, normocephalic Pulmonary: normal non-labored breathing without wheezing Cardiac: regular HR; without carotid bruits Abdomen: soft, NT, no masses; aortic pulse is not palpable Skin: without rashes Vascular Exam/Pulses:  Right Left  Radial 2+ (normal) 2+ (normal)  DP 2+ (normal) 2+ (normal)  PT 2+ (normal) 2+ (normal)   Extremities: she does have spider veins bilaterally with ~ 2cm cluster of spider veins on anterior thigh on the right that has been present since age 67.  Minimal swelling.  No discoloration of toes.   Neurologic: A&O X 3;  moving all extremities equally Psychiatric:  The pt has Normal affect.   Non-Invasive Vascular Imaging:   Venous duplex on 02/02/2021: Venous Reflux Times  +--------------+---------+------+-----------+------------+--------+  RIGHT         Reflux NoRefluxReflux TimeDiameter cmsComments                          Yes                                   +--------------+---------+------+-----------+------------+--------+  CFV                     yes   >1 second                       +--------------+---------+------+-----------+------------+--------+  FV prox       no                                              +--------------+---------+------+-----------+------------+--------+  FV mid        no                                              +--------------+---------+------+-----------+------------+--------+  FV dist       no                                               +--------------+---------+------+-----------+------------+--------+  Popliteal     no                                              +--------------+---------+------+-----------+------------+--------+  GSV at [facility]    no                           0.356              +--------------+---------+------+-----------+------------+--------+  GSV prox thighno                           0.256              +--------------+---------+------+-----------+------------+--------+  GSV mid thigh no                           0.279              +--------------+---------+------+-----------+------------+--------+  GSV dist thighno                           0.361              +--------------+---------+------+-----------+------------+--------+  GSV at knee   no                           0.302              +--------------+---------+------+-----------+------------+--------+  GSV prox calf                              0.215              +--------------+---------+------+-----------+------------+--------+  GSV mid calf                               0.219              +--------------+---------+------+-----------+------------+--------+  SSV Pop Fossa no                           0.155              +--------------+---------+------+-----------+------------+--------+  SSV prox calf no                           0.074              +--------------+---------+------+-----------+------------+--------+  SSV mid calf                               0.114              +--------------+---------+------+-----------+------------+--------+      +--------------+---------+------+-----------+------------+--------+  LEFT          Reflux NoRefluxReflux TimeDiameter cmsComments                          Yes                                   +--------------+---------+------+-----------+------------+--------+  CFV           no                                               +--------------+---------+------+-----------+------------+--------+  FV prox       no                                              +--------------+---------+------+-----------+------------+--------+  FV mid        no                                              +--------------+---------+------+-----------+------------+--------+  FV dist       no                                              +--------------+---------+------+-----------+------------+--------+  Popliteal     no                                              +--------------+---------+------+-----------+------------+--------+  GSV at [facility]    no                           0.429              +--------------+---------+------+-----------+------------+--------+  GSV prox thighno                           0.315              +--------------+---------+------+-----------+------------+--------+  GSV mid thigh no                           0.297              +--------------+---------+------+-----------+------------+--------+  GSV dist thighno                           0.310              +--------------+---------+------+-----------+------------+--------+  GSV at knee                                0.306              +--------------+---------+------+-----------+------------+--------+  GSV prox calf                              0.160              +--------------+---------+------+-----------+------------+--------+  GSV mid calf                               0.114              +--------------+---------+------+-----------+------------+--------+  SSV Pop Fossa no                           0.251              +--------------+---------+------+-----------+------------+--------+  SSV prox calf no                           0.169              +--------------+---------+------+-----------+------------+--------+  SSV mid calf                                0.306              +--------------+---------+------+-----------+------------+--------+   Summary:  Bilateral:  - No evidence of deep vein thrombosis seen in the lower extremities,  bilaterally, from the common femoral through the popliteal veins.  - No evidence of superficial venous thrombosis in the lower extremities, bilaterally.  - No evidence of superficial venous reflux seen in the greater saphenous veins bilaterally.  - No evidence of superficial venous reflux seen in the short saphenous  veins bilaterally.     Right:  - Venous reflux is noted in the right common femoral vein.  Left:  - There is no evidence of venous reflux seen in the left lower extremity.    Previous venous duplex March 2021: Venous Reflux Times  +--------------+---------+------+-----------+------------+--------+  RIGHT         Reflux NoRefluxReflux TimeDiameter cmsComments                          Yes                                   +--------------+---------+------+-----------+------------+--------+  CFV           no                                              +--------------+---------+------+-----------+------------+--------+  FV prox       no                                              +--------------+---------+------+-----------+------------+--------+  FV mid        no                                              +--------------+---------+------+-----------+------------+--------+  FV dist       no                                              +--------------+---------+------+-----------+------------+--------+  Popliteal     no                                              +--------------+---------+------+-----------+------------+--------+  GSV at [facility]    no                            .  37               +--------------+---------+------+-----------+------------+--------+  GSV prox thighno                            .34                +--------------+---------+------+-----------+------------+--------+  GSV mid thigh no                            .34               +--------------+---------+------+-----------+------------+--------+  GSV dist thighno                            .35               +--------------+---------+------+-----------+------------+--------+  GSV at knee   no                            .28               +--------------+---------+------+-----------+------------+--------+  GSV prox calf           yes    2164 ms      .22               +--------------+---------+------+-----------+------------+--------+      +--------------+---------+------+-----------+------------+--------+  LEFT          Reflux NoRefluxReflux TimeDiameter cmsComments                          Yes                                   +--------------+---------+------+-----------+------------+--------+  CFV           no                                              +--------------+---------+------+-----------+------------+--------+  FV prox       no                                              +--------------+---------+------+-----------+------------+--------+  FV mid        no                                              +--------------+---------+------+-----------+------------+--------+  FV dist       no                                              +--------------+---------+------+-----------+------------+--------+  Popliteal     no                                              +--------------+---------+------+-----------+------------+--------+  GSV at [facility]    no                            .43               +--------------+---------+------+-----------+------------+--------+  GSV prox thighno                            .39               +--------------+---------+------+-----------+------------+--------+  GSV mid thigh no                             .41               +--------------+---------+------+-----------+------------+--------+  GSV dist thighno                            .37               +--------------+---------+------+-----------+------------+--------+  GSV at knee   no                            .33               +--------------+---------+------+-----------+------------+--------+  GSV prox calf no                            .34               +--------------+---------+------+-----------+------------+--------+  SSV Pop Fossa no                            .27               +--------------+---------+------+-----------+------------+--------+   Summary:  Bilateral:  - No evidence of deep vein thrombosis seen in the lower extremities,  bilaterally, from the common femoral through the popliteal veins.  - No evidence of superficial venous thrombosis in the lower extremities, bilaterally.  - No evidence of deep venous insufficiency seen bilaterally in the lower extremity.  - No evidence of superficial venous reflux seen in the short saphenous  veins bilaterally.     Right:  - Venous reflux is noted in the right greater saphenous vein in the calf.   Left:  - No evidence of superficial venous reflux seen in the left greater  saphenous vein.  - No evidence of superficial venous reflux seen in the left short  saphenous vein.     Kristina Christian is a 65 y.o. female who presents with: bilateral leg cramping  -pt has easily palpable pedal pulses -pt does not have evidence of DVT.  Pt only has venous reflux in the right CFV, otherwise there is no evidence of reflux.  She does have hemosiderin staining BLE. -she does have spider veins bilaterally; discussed with pt how to hold pressure if these ever bleed (they have never bled in the past).  Also discussed sclerotherapy but feel this will not improve her cramping.   -discussed with pt about continuing to wear compression. -discussed the importance of leg  elevation and how to elevate properly - pt is advised to elevate their legs and a diagram is given to them to demonstrate to lay flat on their  back with knees elevated and slightly bent with their feet higher than her knees, which puts their feet higher than their heart for 15 minutes per day.  She does elevate her legs. -pt is advised to avoid sitting or standing for long periods of time. She is very active. -handout with recommendations given -pt will f/u as needed.   Leontine Locket, [facility] 01/31/2021 10:30 AM  Clinic MD:  Oneida Alar

## 2021-02-02 ENCOUNTER — Ambulatory Visit (HOSPITAL_COMMUNITY)
Admission: RE | Admit: 2021-02-02 | Discharge: 2021-02-02 | Disposition: A | Discharge status: Home / Self Care | Payer: 59 | Source: Ambulatory Visit | Attending: Vascular Surgery | Admitting: Vascular Surgery

## 2021-02-02 ENCOUNTER — Ambulatory Visit (INDEPENDENT_AMBULATORY_CARE_PROVIDER_SITE_OTHER): Payer: 59 | Admitting: Physician Assistant

## 2021-02-02 ENCOUNTER — Other Ambulatory Visit: Payer: Self-pay

## 2021-02-02 VITALS — BP 120/70 | HR 71 | Temp 97.7°F | Resp 20 | Ht 64.0 in | Wt 92.5 lb

## 2021-02-02 DIAGNOSIS — R252 Cramp and spasm: Secondary | ICD-10-CM

## 2021-02-02 DIAGNOSIS — I83813 Varicose veins of bilateral lower extremities with pain: Secondary | ICD-10-CM | POA: Insufficient documentation

## 2021-02-08 ENCOUNTER — Encounter: Payer: Self-pay | Admitting: Family Medicine

## 2021-02-21 ENCOUNTER — Other Ambulatory Visit: Payer: Self-pay

## 2021-02-21 ENCOUNTER — Ambulatory Visit (INDEPENDENT_AMBULATORY_CARE_PROVIDER_SITE_OTHER): Payer: 59 | Admitting: Family Medicine

## 2021-02-21 ENCOUNTER — Encounter: Payer: Self-pay | Admitting: Family Medicine

## 2021-02-21 VITALS — BP 126/78 | HR 92 | Temp 97.8°F | Resp 16 | Ht 64.0 in | Wt 92.0 lb

## 2021-02-21 DIAGNOSIS — J189 Pneumonia, unspecified organism: Secondary | ICD-10-CM

## 2021-02-21 DIAGNOSIS — Z1159 Encounter for screening for other viral diseases: Secondary | ICD-10-CM

## 2021-02-21 DIAGNOSIS — R011 Cardiac murmur, unspecified: Secondary | ICD-10-CM

## 2021-02-21 DIAGNOSIS — M810 Age-related osteoporosis without current pathological fracture: Secondary | ICD-10-CM

## 2021-02-21 DIAGNOSIS — Z1231 Encounter for screening mammogram for malignant neoplasm of breast: Secondary | ICD-10-CM | POA: Diagnosis not present

## 2021-02-21 DIAGNOSIS — E46 Unspecified protein-calorie malnutrition: Secondary | ICD-10-CM | POA: Insufficient documentation

## 2021-02-21 DIAGNOSIS — Z114 Encounter for screening for human immunodeficiency virus [HIV]: Secondary | ICD-10-CM | POA: Diagnosis not present

## 2021-02-21 DIAGNOSIS — R9389 Abnormal findings on diagnostic imaging of other specified body structures: Secondary | ICD-10-CM

## 2021-02-21 DIAGNOSIS — Z0000 Encounter for general adult medical examination without abnormal findings: Secondary | ICD-10-CM

## 2021-02-21 DIAGNOSIS — J209 Acute bronchitis, unspecified: Secondary | ICD-10-CM

## 2021-02-21 DIAGNOSIS — G2581 Restless legs syndrome: Secondary | ICD-10-CM

## 2021-02-21 DIAGNOSIS — J42 Unspecified chronic bronchitis: Secondary | ICD-10-CM

## 2021-02-21 DIAGNOSIS — R634 Abnormal weight loss: Secondary | ICD-10-CM | POA: Insufficient documentation

## 2021-02-21 DIAGNOSIS — Z5181 Encounter for therapeutic drug level monitoring: Secondary | ICD-10-CM

## 2021-02-21 NOTE — Progress Notes (Deleted)
Patient: Kristina Christian, Female    DOB: October 12, 1955, 65 y.o.   MRN: [id number] Delsa Grana, PA-C Visit Date: 02/21/2021  Today's Provider: Delsa Grana, PA-C   Chief Complaint  Patient presents with   Annual Exam   Follow-up   Subjective:   Annual physical exam:  Kristina Christian is a 65 y.o. female who presents today for complete physical exam:  Exercise/Activity: Diet/nutrition: Sleep:     Recent CAP - per [facility]: Results for orders placed or performed in visit on 02/03/21  XR chest 2 views  Narrative  Preliminary Read: RUL patchy increased markings, questionable loculated area  Pt wished to discuss acute complaints *** do routine f/up on chronic conditions today in addition to CPE. Advised pt of separate visit billing/coding  USPSTF grade A and B recommendations - reviewed and addressed today  Depression:  Phq 9 completed today by patient, was reviewed by me with patient in the room PHQ score is ***, pt feels *** PHQ 2/9 Scores 02/21/2021 11/21/2020 03/07/2020 12/23/2019  PHQ - 2 Score 0 0 0 0  PHQ- 9 Score 0 0 0 0   Depression screen [facility] 2/9 02/21/2021 11/21/2020 03/07/2020 12/23/2019 09/28/2019  Decreased Interest 0 0 0 0 0  Down, Depressed, Hopeless 0 0 0 0 0  PHQ - 2 Score 0 0 0 0 0  Altered sleeping 0 0 0 0 2  Tired, decreased energy 0 0 0 0 0  Change in appetite 0 0 0 0 0  Feeling bad or failure about yourself  0 0 0 0 0  Trouble concentrating 0 0 0 0 0  Moving slowly or fidgety/restless 0 0 0 0 0  Suicidal thoughts 0 0 0 0 0  PHQ-9 Score 0 0 0 0 2  Difficult doing work/chores Not difficult at all Not difficult at all Not difficult at all Not difficult at all Not difficult at all    Alcohol screening: [place] Office Visit from 12/23/2019 in [facility]  AUDIT-C Score 0       Immunizations and Health Maintenance: Health Maintenance  Topic Date Due   Pneumococcal Vaccine 54-43 Years old (1 - PCV) Never done   Hepatitis C  Screening  Never done   MAMMOGRAM  10/05/2018   COVID-19 Vaccine (4 - Booster for Pfizer series) 11/19/2020   INFLUENZA VACCINE  02/20/2021   PAP SMEAR-Modifier  04/22/2021   Zoster Vaccines- Shingrix (1 of 2) 05/24/2021 (Originally 04/05/1975)   COLONOSCOPY (Pts 45-22yr Insurance coverage will need to be confirmed)  05/20/2021   DEXA SCAN  03/22/2022   TETANUS/TDAP  01/07/2028   HIV Screening  Completed   HPV VACCINES  Aged Out     Hep C Screening: ***  STD testing and prevention (HIV/chl/gon/syphilis):  see above, no additional testing desired by pt today  Intimate partner violence:***  Sexual History/Pain during Intercourse: Married  Menstrual History/LMP/Abnormal Bleeding: *** No LMP recorded. Patient is postmenopausal.  Incontinence Symptoms: ***  Breast cancer:  Last Mammogram: *see HM list above BRCA gene screening: ***  Cervical cancer screening: *** Pt *** family hx of cancers - breast, ovarian, uterine, colon:     Osteoporosis:   Discussion on osteoporosis per age, including high calcium and vitamin D supplementation, weight bearing exercises Pt is *** supplementing with daily calcium/Vit D. *** Bone scan/dexa Roughly experienced menopause at age ***  Skin cancer:  Hx of skin CA -  NO*** Discussed atypical lesions   Colorectal cancer:  Colonoscopy is ***   Discussed concerning signs and sx of CRC, pt denies ***  Lung cancer:   Low Dose CT Chest recommended if Age 83-80 years, 30 pack-year currently smoking OR have quit w/in 15years. Patient {DOES NOT does:27190} qualify.    Social History   Tobacco Use   Smoking status: Never   Smokeless tobacco: Never  Vaping Use   Vaping Use: Never used  Substance Use Topics   Alcohol use: No    Alcohol/week: 0.0 standard drinks   Drug use: No     Flowsheet Row Office Visit from 12/23/2019 in [facility]  AUDIT-C Score 0       Family History  Problem Relation Age of Onset   Von  Willebrand disease Mother    Prostate cancer Brother    Bladder Cancer Maternal Grandfather      Blood pressure/Hypertension: BP Readings from Last 3 Encounters:  02/21/21 126/78  02/02/21 120/70  11/21/20 134/84    Weight/Obesity: Wt Readings from Last 3 Encounters:  02/21/21 92 lb (41.7 kg)  02/02/21 92 lb 8 oz (42 kg)  11/21/20 95 lb 3.2 oz (43.2 kg)   BMI Readings from Last 3 Encounters:  02/21/21 15.79 kg/m  02/02/21 15.88 kg/m  11/21/20 16.34 kg/m     Lipids:  Lab Results  Component Value Date   CHOL 182 01/06/2018   Lab Results  Component Value Date   HDL 71 01/06/2018   Lab Results  Component Value Date   LDLCALC 94 01/06/2018   Lab Results  Component Value Date   TRIG 77 01/06/2018   Lab Results  Component Value Date   CHOLHDL 2.6 01/06/2018   No results found for: LDLDIRECT Based on the results of lipid panel his/her cardiovascular risk factor ( using [employer] )  in the next 10 years is: The ASCVD Risk score Mikey Bussing DC Jr., et al., 2013) failed to calculate for the following reasons:   Cannot find a previous HDL lab   Cannot find a previous total cholesterol lab Glucose:  Glucose, Bld  Date Value Ref Range Status  05/06/2020 100 (H) 70 - 99 mg/dL Final    Comment:    Glucose reference range applies only to samples taken after fasting for at least 8 hours.  12/23/2019 77 65 - 99 mg/dL Final    Comment:    .            Fasting reference interval .   01/06/2018 86 65 - 139 mg/dL Final    Comment:    .        Non-fasting reference interval .    Hypertension: BP Readings from Last 3 Encounters:  02/21/21 126/78  02/02/21 120/70  11/21/20 134/84   Obesity: Wt Readings from Last 3 Encounters:  02/21/21 92 lb (41.7 kg)  02/02/21 92 lb 8 oz (42 kg)  11/21/20 95 lb 3.2 oz (43.2 kg)   BMI Readings from Last 3 Encounters:  02/21/21 15.79 kg/m  02/02/21 15.88 kg/m  11/21/20 16.34 kg/m      Advanced Care Planning:  A  voluntary discussion about advance care planning including the explanation and discussion of advance directives.   Discussed health care proxy and Living will, and the patient was able to identify a health care proxy as ***.   Patient {DOES_DOES CXK:48185} have a living will at present time.   Social History      She        Social History  Socioeconomic History   Marital status: Married    Spouse name: Gwenlyn Perking   Number of children: 2   Years of education: Not on file   Highest education level: Not on file  Occupational History   Not on file  Tobacco Use   Smoking status: Never   Smokeless tobacco: Never  Vaping Use   Vaping Use: Never used  Substance and Sexual Activity   Alcohol use: No    Alcohol/week: 0.0 standard drinks   Drug use: No   Sexual activity: Yes    Partners: Male    Birth control/protection: Post-menopausal  Other Topics Concern   Not on file  Social History Narrative   Not on file   Social Determinants of Health   Financial Resource Strain: Low Risk    Difficulty of Paying Living Expenses: Not hard at all  Food Insecurity: No Food Insecurity   Worried About Charity fundraiser in the Last Year: Never true   [place] in the Last Year: Never true  Transportation Needs: No Transportation Needs   Lack of Transportation (Medical): No   Lack of Transportation (Non-Medical): No  Physical Activity: Insufficiently Active   Days of Exercise per Week: 3 days   Minutes of Exercise per Session: 30 min  Stress: Stress Concern Present   Feeling of Stress : To some extent  Social Connections: Socially Integrated   Frequency of Communication with Friends and Family: More than three times a week   Frequency of Social Gatherings with Friends and Family: Three times a week   Attends Religious Services: 1 to 4 times per year   Active Member of Clubs or Organizations: Yes   Attends Music therapist: More than 4 times per year   Marital Status:  Married    Family History        Family History  Problem Relation Age of Onset   Von Willebrand disease Mother    Prostate cancer Brother    Bladder Cancer Maternal Grandfather     Patient Active Problem List   Diagnosis Date Noted   Anserine bursitis 03/07/2020   Venous stasis dermatitis of right lower extremity 10/06/2019   Preventative health care 05/05/2018   Spider vein of lower extremity 01/06/2018   Chronic bronchitis with acute exacerbation ([place]) 06/11/2017   Chronic bronchitis ([place]) 06/11/2017   Disorder of uvula 08/22/2016   Nodule of finger of both hands 09/19/2015   Pharyngeal disorder 09/19/2015   Sun-induced skin changes, keratosis 03/22/2015   LBP (low back pain) 01/27/2015   Osteoporosis 01/27/2015   Varicose veins of leg with pain, bilateral 01/27/2015   Allergic rhinitis, seasonal 01/27/2015   RAD (reactive airway disease) 01/27/2015    Past Surgical History:  Procedure Laterality Date   COLONOSCOPY WITH PROPOFOL N/A 05/20/2018   Procedure: COLONOSCOPY WITH PROPOFOL;  Surgeon: Jonathon Bellows, MD;  Location: [facility] ENDOSCOPY;  Service: Gastroenterology;  Laterality: N/A;   TONSILLECTOMY AND ADENOIDECTOMY  04/22/2007     Current Outpatient Medications:    albuterol (PROVENTIL HFA;VENTOLIN HFA) 108 (90 Base) MCG/ACT inhaler, Inhale 2 puffs every 4 (four) hours as needed into the lungs for wheezing or shortness of breath., Disp: 1 Inhaler, Rfl: 1   alendronate (FOSAMAX) 70 MG tablet, Take 1 tablet (70 mg total) by mouth every 7 (seven) days. Take with a full glass of water on an empty stomach., Disp: 4 tablet, Rfl: 11   b complex vitamins capsule, Take 1 capsule by mouth daily., Disp: ,  Rfl:    calcium carbonate (OS-CAL) 600 MG TABS tablet, Take 500 mg by mouth 2 (two) times daily. , Disp: , Rfl:    cholecalciferol (VITAMIN D) 1000 units tablet, Take 1,000 Units by mouth daily., Disp: , Rfl:    fluticasone (FLONASE) 50 MCG/ACT nasal spray, Place 2 sprays as  needed into both nostrils., Disp: 16 g, Rfl: 11   levocetirizine (XYZAL) 5 MG tablet, Take 5 mg by mouth as needed for allergies., Disp: , Rfl:    magnesium oxide (MAG-OX) 400 MG tablet, Take 400 mg by mouth daily., Disp: , Rfl:    MULTIPLE VITAMINS PO, Take daily by mouth. , Disp: , Rfl:    vitamin C (ASCORBIC ACID) 500 MG tablet, Take 500 mg by mouth daily., Disp: , Rfl:    zinc gluconate 50 MG tablet, Take 50 mg by mouth daily., Disp: , Rfl:   Allergies  Allergen Reactions   Breo Ellipta [Fluticasone Furoate-Vilanterol]     Patient Care Team: Delsa Grana, PA-C as PCP - General (Family Medicine)  Review of Systems   ***       Objective:   Vitals:  Vitals:   02/21/21 1126  BP: 126/78  Pulse: 92  Resp: 16  Temp: 97.8 F (36.6 C)  SpO2: 98%  Weight: 92 lb (41.7 kg)  Height: 5' 4"  (1.626 m)    Body mass index is 15.79 kg/m.  Physical Exam    Fall Risk: Fall Risk  02/21/2021 11/21/2020 03/07/2020 12/23/2019 09/28/2019  Falls in the past year? 0 0 0 0 0  Number falls in past yr: 0 0 0 0 0  Injury with Fall? 0 0 0 0 0    Functional Status Survey: Is the patient deaf or have difficulty hearing?: No Does the patient have difficulty seeing, even when wearing glasses/contacts?: No Does the patient have difficulty concentrating, remembering, or making decisions?: No Does the patient have difficulty walking or climbing stairs?: No Does the patient have difficulty dressing or bathing?: No Does the patient have difficulty doing errands alone such as visiting a doctor's office or shopping?: No   Assessment & Plan:    CPE completed today  USPSTF grade A and B recommendations reviewed with patient; age-appropriate recommendations, preventive care, screening tests, etc discussed and encouraged; healthy living encouraged; see AVS for patient education given to patient  Discussed importance of 150 minutes of physical activity weekly, AHA exercise recommendations given to pt in  AVS/handout  Discussed importance of healthy diet:  eating lean meats and proteins, avoiding trans fats and saturated fats, avoid simple sugars and excessive carbs in diet, eat 6 servings of fruit/vegetables daily and drink plenty of water and avoid sweet beverages.    Recommended pt to do annual eye exam and routine dental exams/cleanings  Depression, alcohol, fall screening completed as documented above and per flowsheets  Reviewed Health Maintenance: Health Maintenance  Topic Date Due   Pneumococcal Vaccine 48-31 Years old (1 - PCV) Never done   Hepatitis C Screening  Never done   MAMMOGRAM  10/05/2018   COVID-19 Vaccine (4 - Booster for Pfizer series) 11/19/2020   INFLUENZA VACCINE  02/20/2021   PAP SMEAR-Modifier  04/22/2021   Zoster Vaccines- Shingrix (1 of 2) 05/24/2021 (Originally 04/05/1975)   COLONOSCOPY (Pts 45-57yr Insurance coverage will need to be confirmed)  05/20/2021   DEXA SCAN  03/22/2022   TETANUS/TDAP  01/07/2028   HIV Screening  Completed   HPV VACCINES  Aged Out  Immunizations: Immunization History  Administered Date(s) Administered   PFIZER Comirnaty(Gray Top)Covid-19 Tri-Sucrose Vaccine 08/22/2020   PFIZER(Purple Top)SARS-COV-2 Vaccination 12/05/2019, 12/29/2019   Tdap 04/22/2007, 01/06/2018   Tetanus 04/22/2007    ***  No orders of the defined types were placed in this encounter.       Delsa Grana, PA-C 02/21/21 11:45 AM  [facility]

## 2021-02-21 NOTE — Patient Instructions (Addendum)
Get your chest X-ray done at [facility] outpatient imaging in 3-4 weeks prior to your follow up appointment  M-F 8-4   Call me if you have any back or spine problems before then and I can add on additional spine imaging   Protein-Energy Malnutrition Protein-energy malnutrition is when a person does not eat enough protein, fat, and calories. When this happens over time, it can lead to severe loss of muscle tissue (muscle wasting). This condition also affects the body's defense system (immune system) and can lead to other health problems. What are the causes? This condition may be caused by: Not eating enough protein, fat, or calories. Having certain chronic medical conditions. Eating too little. What increases the risk? The following factors may make you more likely to develop this condition: Living in poverty. Long-term hospitalization. Alcohol or drug dependency. Addiction often leads to a lifestyle in which proper diet is ignored. Dependency can also hurt the metabolism and the body's ability to absorb nutrients. Eating disorders, such as anorexia nervosa or bulimia. Chewing or swallowing problems. People with these disorders may not eat enough. Having certain conditions, such as: Inflammatory bowel disease. Inflammation of the intestines makes it difficult for the body to absorb nutrients. Cancer or AIDS. These diseases can cause a loss of appetite. Chronic heart failure. This interferes with how the body uses nutrients. Cystic fibrosis. This disease can make it difficult for the body to absorb nutrients. Eating a diet that extremely restricts protein, fat, or calorie intake. What are the signs or symptoms? Symptoms of this condition include: Tiredness (fatigue). Weakness. Dizziness. Fainting. Weight loss. Loss of muscle tone and muscle mass. Poor immune response. Lack of menstruation. Poor memory. Hair loss. Skin changes. How is this diagnosed? This condition may be  diagnosed based on: Your medical and dietary history. A physical exam. This may include a measurement of your body mass index. Blood tests. How is this treated? This condition may be managed with: Nutrition therapy. This may include working with a Microbiologist. Treatment for underlying conditions. People with severe protein-energy malnutrition may need to be treated in ahospital. This may involve receiving nutrition and fluids through an IV. Follow these instructions at home:  Eat a balanced diet. In each meal, include at least one food that is high in protein. Foods that are high in protein include: Meat. Poultry. Fish. Eggs. Cheese. Milk. Beans. Nuts. Eat nutrient-rich foods that are easy to swallow and digest, such as: Fruit and yogurt smoothies. Oatmeal with nut butter. Nutrition supplement drinks. Try to eat six small meals each day instead of three large meals. Take vitamin and protein supplements as told by your health care provider or dietitian. Follow your health care provider's recommendations about exercise and activity. Keep all follow-up visits. This is important. Contact a health care provider if: You have increased weakness or fatigue. You faint. You are a woman and you stop having your period (menstruating). You have rapid hair loss. You have unexpected weight loss. You have diarrhea. You have nausea and vomiting. Get help right away if: You have difficulty breathing. You have chest pain. These symptoms may represent a serious problem that is an emergency. Do not wait to see if the symptoms will go away. Get medical help right away. Call your local emergency services (911 in the [place] Do not drive yourself to the hospital. Summary Protein-energy malnutrition is when a person does not eat enough protein, fat, and calories. Protein-energy malnutrition can lead to severe loss of muscle tissue (  muscle wasting). This condition also affects the body's defense system  (immune system) and can lead to other health problems. Talk with your health care provider about treatment for this condition. Effective treatment depends on the underlying cause of the malnutrition. This information is not intended to replace advice given to you by your health care provider. Make sure you discuss any questions you have with your healthcare provider. Document Revised: 07/09/2020 Document Reviewed: 07/09/2020 Elsevier Patient Education  2022 [employer].

## 2021-02-21 NOTE — Progress Notes (Signed)
Patient ID: Kristina Christian, female    DOB: 1955/10/03, 65 y.o.   MRN: [id number]  PCP: Delsa Grana, PA-C  Chief Complaint  Patient presents with   Annual Exam   Follow-up    Subjective:   Kristina Christian is a 65 y.o. female, presents to clinic for CPE, however she has multiple concerns and recent illness needing f/up as well as new weight loss  F/up on pneumonia 3 weeks ago URI runny nose, hx of chronic bronchitis - dx with pneumonia 3 weeks ago, no GI sx, cough getting better, she was treated with zpak and tessalon, she rarely used her albuterol inhaler.  Continues to have productive cough but dyspnea is better for the past week, no recent fevers, sweats/night sweats, no lymphadenopathy, hemoptysis - sputum is clear, she feels congestion and rattling in her upper chest/airway, denies any pleuritic cp, rib pain, back pain She and her husband were sick, covid neg, no recent travel or other sick contacts Used breo in the past and it irritated throat so she states she will "never use inhalers again"  No past Pulm consult?   PFT's Last CXR in system was done by Dr. Sanda Klein her prior pcp was in 2018, reviewed today, normal with no signs of chronic lung disease  Concern with weight loss - longterm trouble with maintaining weight and recently with cap she lost 5 lbs  Eats extremely healthy but eats "a lot - like a horse"  Doesn't know calorie content but she did start boost supplements high in protein and is adding protein powder to smoothies - her scale at home shows her down nearly 15 lbs from her normal No past thyroid issues, no swelling to neck/throat, no dysphagia, no lymphadenopathy Denies breast changes Denies changes in bowels, blood in stool, abd pain She is concerned with hair loss over most of her body to arms, legs, armpits and pubic hair, but no change to her head of hair.     Osteoporosis - on Vit D and calcium supplement and alendronate  Dexa 02/2020 reviewed and last labs  reviewed, prior Vit D was normal, calcium normal No prior PTH done   Vit B12 was high in the past - she is oversupplementing still Iron panel done previously to eval paresthesias was normal    Patient Active Problem List   Diagnosis Date Noted   Anserine bursitis 03/07/2020   Venous stasis dermatitis of right lower extremity 10/06/2019   Preventative health care 05/05/2018   Spider vein of lower extremity 01/06/2018   Chronic bronchitis with acute exacerbation ([place]) 06/11/2017   Chronic bronchitis ([place]) 06/11/2017   Disorder of uvula 08/22/2016   Nodule of finger of both hands 09/19/2015   Pharyngeal disorder 09/19/2015   Sun-induced skin changes, keratosis 03/22/2015   LBP (low back pain) 01/27/2015   Osteoporosis 01/27/2015   Varicose veins of leg with pain, bilateral 01/27/2015   Allergic rhinitis, seasonal 01/27/2015   RAD (reactive airway disease) 01/27/2015      Current Outpatient Medications:    albuterol (PROVENTIL HFA;VENTOLIN HFA) 108 (90 Base) MCG/ACT inhaler, Inhale 2 puffs every 4 (four) hours as needed into the lungs for wheezing or shortness of breath., Disp: 1 Inhaler, Rfl: 1   alendronate (FOSAMAX) 70 MG tablet, Take 1 tablet (70 mg total) by mouth every 7 (seven) days. Take with a full glass of water on an empty stomach., Disp: 4 tablet, Rfl: 11   b complex vitamins capsule, Take 1 capsule by mouth daily.,  Disp: , Rfl:    calcium carbonate (OS-CAL) 600 MG TABS tablet, Take 500 mg by mouth 2 (two) times daily. , Disp: , Rfl:    cholecalciferol (VITAMIN D) 1000 units tablet, Take 1,000 Units by mouth daily., Disp: , Rfl:    fluticasone (FLONASE) 50 MCG/ACT nasal spray, Place 2 sprays as needed into both nostrils., Disp: 16 g, Rfl: 11   levocetirizine (XYZAL) 5 MG tablet, Take 5 mg by mouth as needed for allergies., Disp: , Rfl:    magnesium oxide (MAG-OX) 400 MG tablet, Take 400 mg by mouth daily., Disp: , Rfl:    MULTIPLE VITAMINS PO, Take daily by mouth. , Disp:  , Rfl:    vitamin C (ASCORBIC ACID) 500 MG tablet, Take 500 mg by mouth daily., Disp: , Rfl:    zinc gluconate 50 MG tablet, Take 50 mg by mouth daily., Disp: , Rfl:    Allergies  Allergen Reactions   Breo Ellipta [Fluticasone Furoate-Vilanterol]      Social History   Tobacco Use   Smoking status: Never   Smokeless tobacco: Never  Vaping Use   Vaping Use: Never used  Substance Use Topics   Alcohol use: No    Alcohol/week: 0.0 standard drinks   Drug use: No      Chart Review Today: I personally reviewed active problem list, medication list, allergies, family history, social history, health maintenance, notes from last encounter, lab results, imaging with the patient/caregiver today.   Review of Systems  Constitutional:  Positive for unexpected weight change. Negative for activity change, appetite change, chills, diaphoresis, fatigue and fever.  HENT: Negative.  Negative for congestion, postnasal drip, rhinorrhea, sinus pressure, trouble swallowing and voice change.   Eyes: Negative.   Respiratory: Negative.  Negative for shortness of breath.   Cardiovascular: Negative.  Negative for chest pain, palpitations and leg swelling.  Gastrointestinal: Negative.  Negative for abdominal distention, abdominal pain, blood in stool, constipation, diarrhea, nausea and vomiting.  Endocrine: Negative.  Negative for cold intolerance, heat intolerance, polydipsia, polyphagia and polyuria.  Genitourinary: Negative.   Musculoskeletal:  Positive for back pain. Negative for arthralgias, gait problem, joint swelling and myalgias.  Skin: Negative.  Negative for color change, pallor and rash.  Allergic/Immunologic: Negative.   Neurological: Negative.  Negative for dizziness, syncope, facial asymmetry, weakness, light-headedness, numbness and headaches.  Hematological: Negative.  Negative for adenopathy. Does not bruise/bleed easily.  Psychiatric/Behavioral: Negative.    All other systems reviewed  and are negative.     Objective:   Vitals:   02/21/21 1126  BP: 126/78  Pulse: 92  Resp: 16  Temp: 97.8 F (36.6 C)  SpO2: 98%  Weight: 92 lb (41.7 kg)  Height: '5\' 4"'$  (1.626 m)    Body mass index is 15.79 kg/m.  Physical Exam Constitutional:      General: She is not in acute distress.    Appearance: Normal appearance. She is well-developed, well-groomed and underweight. She is not ill-appearing, toxic-appearing or diaphoretic.     Interventions: Face mask in place.  HENT:     Head: Normocephalic and atraumatic.     Right Ear: External ear normal.     Left Ear: External ear normal.  Eyes:     General: No scleral icterus.       Right eye: No discharge.        Left eye: No discharge.     Conjunctiva/sclera: Conjunctivae normal.  Neck:     Thyroid: No thyroid mass, thyromegaly or  thyroid tenderness.     Trachea: Trachea and phonation normal.  Cardiovascular:     Rate and Rhythm: Normal rate and regular rhythm.     Pulses: Normal pulses.     Heart sounds: Murmur heard.    No friction rub. No gallop.  Pulmonary:     Effort: Pulmonary effort is normal. No tachypnea, accessory muscle usage, prolonged expiration, respiratory distress or retractions.     Breath sounds: Normal breath sounds. No decreased air movement or transmitted upper airway sounds. No decreased breath sounds, wheezing, rhonchi or rales.     Comments: Frequent coughing Lungs CTA A&P, no rales or rhonchi Faint wheeze heard once on examination of heart, but never again when auscultating lungs A, P and laterally  Abdominal:     General: Bowel sounds are normal. There is no distension.     Palpations: Abdomen is soft.     Tenderness: There is no abdominal tenderness. There is no guarding or rebound.  Musculoskeletal:     Cervical back: Normal range of motion and neck supple.     Right lower leg: No edema.     Left lower leg: No edema.  Lymphadenopathy:     Cervical: No cervical adenopathy.  Skin:     General: Skin is warm.     Coloration: Skin is not jaundiced or pale.     Findings: No lesion or rash.  Neurological:     Mental Status: She is alert. Mental status is at baseline.     Gait: Gait normal.  Psychiatric:        Attention and Perception: Attention normal.        Mood and Affect: Mood is anxious. Mood is not depressed.        Speech: Speech normal.        Behavior: Behavior is hyperactive. Behavior is cooperative.        Cognition and Memory: Cognition and memory normal.     Results for orders placed or performed in visit on 08/05/20  Novel Coronavirus, NAA ([facility])   Specimen: Nasopharyngeal(NP) swabs in vial transport medium   Nasopharynge  Screenin  Result Value Ref Range   SARS-CoV-2, NAA Not Detected Not Detected  SARS-COV-2, NAA 2 DAY TAT   Nasopharynge  Screenin  Result Value Ref Range   SARS-CoV-2, NAA 2 DAY TAT Performed        Assessment & Plan:     ICD-10-CM   1. Pneumonia of right upper lobe due to infectious organism  J18.9 DG Chest 2 View    Ambulatory referral to Pulmonology   lungs clear today, sx improving, residual frequent cough, but no SOB, wheeze, fever, sweats, repeat CXR in 3 weeks, fup in office    2. Encounter for hepatitis C screening test for low risk patient  Z11.59 Hepatitis C Antibody    3. Screening for HIV without presence of risk factors  Z11.4 HIV antibody (with reflex)    4. Encounter for screening mammogram for malignant neoplasm of breast  Z12.31 MM 3D SCREEN BREAST BILATERAL    5. Abnormal CXR (chest x-ray)  R93.89 Ambulatory referral to Pulmonology   f/up CXR following pneumonia diagnosed at [facility]     6. Age-related osteoporosis without current pathological fracture  M81.0 VITAMIN D 25 Hydroxy (Vit-D Deficiency, Fractures)    Parathyroid hormone, intact (no Ca)   on fosamax and doing well with no SE or concerns, recheck PTH (never done) vit D and calcium now that she is on treatment  7. RLS (restless legs syndrome)   G25.81    improved    8. Chronic bronchitis with acute exacerbation ([place])  J20.9 DG Chest 2 View   J42 Ambulatory referral to Pulmonology   consult to pulm, repeat CXR - hope to get PFT's    9. Protein-calorie malnutrition, unspecified severity ([facility])  [id number] COMPLETE METABOLIC PANEL WITH GFR    CBC w/Diff/Platelet    TSH    T4   weight normally 102, on home scale down to 88, asked pt to count calories and boost, bring in at next OV, add fat/protein to diet - handout given    10. Unintentional weight loss of 5% body weight or less within 1 month  R63.4 TSH    DG Chest 2 View    T4    Ambulatory referral to Pulmonology   cough/CAP - unsure if it was CAP after review of [facility] records, cxr repeat, labs, f/o hyperthyroid, increase calories  Plan to f/up on calorie intake, adjust and monitor with following weights   Consult nutrition if at f/up weight is not improving or maintained  Basic labs done and thryoid  Other than cough and CAP with hx of lung disease no signs or sx of any other possible malignancy  Discussed with pt chronic lung disease management - may cause failure to thrive if not managed - may want to consider treatment if recommended by pulm after consult and work up  Imaging from UC stated loculated abnormality - will repeat imaging to ensure resolution of CAP - f/up in any abnormal findings - lung mass? Doubt infection - but may need to work that up as well  No lymphadenopathy, abd pain      11. Medication monitoring encounter  [id number] COMPLETE METABOLIC PANEL WITH GFR    Lipid panel    CBC w/Diff/Platelet    12. Annual physical exam  123456 COMPLETE METABOLIC PANEL WITH GFR    Lipid panel    CBC w/Diff/Platelet    TSH   labs for physical done - but CPE rescheduled to be completed when pt is well in the next month     13. Systolic murmur No DOE, LE edema, palpitations - new on exam - heard best at mitral area - ECHOCARDIOGRAM COMPLETE      Delsa Grana,  PA-C 02/21/21 11:55 AM

## 2021-02-22 LAB — CBC WITH DIFFERENTIAL/PLATELET
Absolute Monocytes: 419 cells/uL (ref 200–950)
Basophils Absolute: 19 cells/uL (ref 0–200)
Basophils Relative: 0.6 %
Eosinophils Absolute: 42 cells/uL (ref 15–500)
Eosinophils Relative: 1.3 %
HCT: 39.8 % (ref 35.0–45.0)
Hemoglobin: 13.2 g/dL (ref 11.7–15.5)
Lymphs Abs: 922 cells/uL (ref 850–3900)
MCH: 32.8 pg (ref 27.0–33.0)
MCHC: 33.2 g/dL (ref 32.0–36.0)
MCV: 99 fL (ref 80.0–100.0)
MPV: 10.9 fL (ref 7.5–12.5)
Monocytes Relative: 13.1 %
Neutro Abs: 1798 cells/uL (ref 1500–7800)
Neutrophils Relative %: 56.2 %
Platelets: 217 10*3/uL (ref 140–400)
RBC: 4.02 10*6/uL (ref 3.80–5.10)
RDW: 11.7 % (ref 11.0–15.0)
Total Lymphocyte: 28.8 %
WBC: 3.2 10*3/uL — ABNORMAL LOW (ref 3.8–10.8)

## 2021-02-22 LAB — VITAMIN D 25 HYDROXY (VIT D DEFICIENCY, FRACTURES): Vit D, 25-Hydroxy: 73 ng/mL (ref 30–100)

## 2021-02-22 LAB — HEPATITIS C ANTIBODY
Hepatitis C Ab: NONREACTIVE
SIGNAL TO CUT-OFF: 0.01 (ref <1.00)

## 2021-02-22 LAB — COMPLETE METABOLIC PANEL WITH GFR
AG Ratio: 1.9 (calc) (ref 1.0–2.5)
ALT: 18 U/L (ref 6–29)
AST: 20 U/L (ref 10–35)
Albumin: 4.7 g/dL (ref 3.6–5.1)
Alkaline phosphatase (APISO): 57 U/L (ref 37–153)
BUN: 17 mg/dL (ref 7–25)
CO2: 29 mmol/L (ref 20–32)
Calcium: 9.6 mg/dL (ref 8.6–10.4)
Chloride: 104 mmol/L (ref 98–110)
Creat: 0.71 mg/dL (ref 0.50–1.05)
Globulin: 2.5 g/dL (calc) (ref 1.9–3.7)
Glucose, Bld: 64 mg/dL — ABNORMAL LOW (ref 65–99)
Potassium: 4.2 mmol/L (ref 3.5–5.3)
Sodium: 141 mmol/L (ref 135–146)
Total Bilirubin: 0.7 mg/dL (ref 0.2–1.2)
Total Protein: 7.2 g/dL (ref 6.1–8.1)
eGFR: 95 mL/min/{1.73_m2} (ref >=60)

## 2021-02-22 LAB — TSH: TSH: 1.27 mIU/L (ref 0.40–4.50)

## 2021-02-22 LAB — T4: T4, Total: 9.7 ug/dL (ref 5.1–11.9)

## 2021-02-22 LAB — LIPID PANEL
Cholesterol: 170 mg/dL (ref <200)
HDL: 71 mg/dL (ref >=50)
LDL Cholesterol (Calc): 80 mg/dL (calc)
Non-HDL Cholesterol (Calc): 99 mg/dL (calc) (ref <130)
Total CHOL/HDL Ratio: 2.4 (calc) (ref <5.0)
Triglycerides: 103 mg/dL (ref <150)

## 2021-02-22 LAB — HIV ANTIBODY (ROUTINE TESTING W REFLEX): HIV 1&2 Ab, 4th Generation: NONREACTIVE

## 2021-02-22 LAB — PARATHYROID HORMONE, INTACT (NO CA): PTH: 30 pg/mL (ref 16–77)

## 2021-03-03 ENCOUNTER — Encounter: Payer: Self-pay | Admitting: Family Medicine

## 2021-03-15 ENCOUNTER — Ambulatory Visit
Admission: RE | Admit: 2021-03-15 | Discharge: 2021-03-15 | Disposition: A | Discharge status: Home / Self Care | Payer: 59 | Attending: Family Medicine | Admitting: Family Medicine

## 2021-03-15 ENCOUNTER — Ambulatory Visit
Admission: RE | Admit: 2021-03-15 | Discharge: 2021-03-15 | Disposition: A | Discharge status: Home / Self Care | Payer: 59 | Source: Ambulatory Visit | Attending: Family Medicine | Admitting: Family Medicine

## 2021-03-15 DIAGNOSIS — R634 Abnormal weight loss: Secondary | ICD-10-CM | POA: Insufficient documentation

## 2021-03-15 DIAGNOSIS — J189 Pneumonia, unspecified organism: Secondary | ICD-10-CM

## 2021-03-15 DIAGNOSIS — J209 Acute bronchitis, unspecified: Secondary | ICD-10-CM | POA: Diagnosis not present

## 2021-03-15 DIAGNOSIS — J42 Unspecified chronic bronchitis: Secondary | ICD-10-CM | POA: Diagnosis present

## 2021-03-24 ENCOUNTER — Encounter: Payer: 59 | Admitting: Family Medicine

## 2021-04-06 ENCOUNTER — Other Ambulatory Visit: Payer: Self-pay

## 2021-04-06 ENCOUNTER — Ambulatory Visit (INDEPENDENT_AMBULATORY_CARE_PROVIDER_SITE_OTHER): Payer: 59 | Admitting: Family Medicine

## 2021-04-06 ENCOUNTER — Encounter: Payer: Self-pay | Admitting: Family Medicine

## 2021-04-06 VITALS — BP 102/58 | HR 55 | Temp 97.6°F | Resp 16 | Ht 64.0 in | Wt 93.9 lb

## 2021-04-06 DIAGNOSIS — Z124 Encounter for screening for malignant neoplasm of cervix: Secondary | ICD-10-CM | POA: Diagnosis not present

## 2021-04-06 DIAGNOSIS — E46 Unspecified protein-calorie malnutrition: Secondary | ICD-10-CM

## 2021-04-06 DIAGNOSIS — Z0000 Encounter for general adult medical examination without abnormal findings: Secondary | ICD-10-CM

## 2021-04-06 NOTE — Progress Notes (Signed)
Patient: Kristina Christian, Female    DOB: [birth year], 65 y.o.   MRN: [id number] Delsa Grana, PA-C Visit Date: 04/06/2021  Today's Provider: Delsa Grana, PA-C   Chief Complaint  Patient presents with   Annual Exam   Subjective:   Annual physical exam:  Kristina Christian is a 65 y.o. female who presents today for complete physical exam:  Exercise/Activity:   active  Diet/nutrition:   adding calories and trying to maintain weight Sleep:  sleeps well   SDOH Screenings   Alcohol Screen: Not on file  Depression (PHQ2-9): Low Risk    PHQ-2 Score: 0  Financial Resource Strain: Low Risk    Difficulty of Paying Living Expenses: Not hard at all  Food Insecurity: No Food Insecurity   Worried About Charity fundraiser in the Last Year: Never true   [place] in the Last Year: Never true  Housing: [place] Risk Score: 0  Physical Activity: Insufficiently Active   Days of Exercise per Week: 3 days   Minutes of Exercise per Session: 30 min  Social Connections: Unknown   Frequency of Communication with Friends and Family: More than three times a week   Frequency of Social Gatherings with Friends and Family: More than three times a week   Attends Religious Services: Patient refused   Active Member of Clubs or Organizations: Yes   Attends Archivist Meetings: 1 to 4 times per year   Marital Status: Married  Stress: No Stress Concern Present   Feeling of Stress : Only a little  Tobacco Use: Low Risk    Smoking Tobacco Use: Never   Smokeless Tobacco Use: Never  Transportation Needs: No Transportation Needs   Lack of Transportation (Medical): No   Lack of Transportation (Non-Medical): No    Was loosing weight - brings in a log of foods/calories - many days~1300 cal, others up to 2000  Wt Readings from Last 10 Encounters:  04/06/21 93 lb 14.4 oz (42.6 kg)  02/21/21 92 lb (41.7 kg)  02/02/21 92 lb 8 oz (42 kg)  11/21/20 95 lb 3.2 oz (43.2 kg)  05/06/20  94 lb (42.6 kg)  03/07/20 96 lb (43.5 kg)  12/23/19 95 lb 1.6 oz (43.1 kg)  10/06/19 97 lb 9.6 oz (44.3 kg)  09/28/19 95 lb (43.1 kg)  05/20/18 102 lb (46.3 kg)   BMI Readings from Last 5 Encounters:  04/06/21 16.12 kg/m  02/21/21 15.79 kg/m  02/02/21 15.88 kg/m  11/21/20 16.34 kg/m  05/06/20 16.14 kg/m     USPSTF grade A and B recommendations - reviewed and addressed today  Depression:  Phq 9 completed today by patient, was reviewed by me with patient in the room PHQ score is neg, pt feels good PHQ 2/9 Scores 04/06/2021 02/21/2021 11/21/2020 03/07/2020  PHQ - 2 Score 0 0 0 0  PHQ- 9 Score 0 0 0 0   Depression screen [facility] 2/9 04/06/2021 02/21/2021 11/21/2020 03/07/2020 12/23/2019  Decreased Interest 0 0 0 0 0  Down, Depressed, Hopeless 0 0 0 0 0  PHQ - 2 Score 0 0 0 0 0  Altered sleeping 0 0 0 0 0  Tired, decreased energy 0 0 0 0 0  Change in appetite 0 0 0 0 0  Feeling bad or failure about yourself  0 0 0 0 0  Trouble concentrating 0 0 0 0 0  Moving slowly or fidgety/restless 0 0 0 0 0  Suicidal thoughts 0 0 0 0 0  PHQ-9 Score 0 0 0 0 0  Difficult doing work/chores Not difficult at all Not difficult at all Not difficult at all Not difficult at all Not difficult at all  Some recent data might be hidden    Alcohol screening: [place] Office Visit from 12/23/2019 in [facility]  AUDIT-C Score 0       Immunizations and Health Maintenance: Health Maintenance  Topic Date Due   PNA vac Low Risk Adult (1 of 2 - PCV13) 04/04/2021   PAP SMEAR-Modifier  04/22/2021   INFLUENZA VACCINE  04/09/2021 (Originally 02/20/2021)   COVID-19 Vaccine (4 - Booster for [place] series) 04/22/2021 (Originally 11/14/2020)   Zoster Vaccines- Shingrix (1 of 2) 05/24/2021 (Originally 04/05/1975)   MAMMOGRAM  04/06/2022 (Originally 10/05/2018)   COLONOSCOPY (Pts 45-69yr Insurance coverage will need to be confirmed)  05/20/2021   DEXA SCAN  03/22/2022   TETANUS/TDAP  01/07/2028    Hepatitis C Screening  Completed   HIV Screening  Completed   HPV VACCINES  Aged Out     Hep C Screening: done  STD testing and prevention (HIV/chl/gon/syphilis):  see above, no additional testing desired by pt today  Intimate partner violence: safe, denies abuse  Sexual History/Pain during Intercourse: Married  Menstrual History/LMP/Abnormal Bleeding: no AUB No LMP recorded. Patient is postmenopausal.  Incontinence Symptoms:  none  Breast cancer:  ordered - pt needs to schedule Last Mammogram: *see HM list above BRCA gene screening: none known   Cervical cancer screening:  refuses today - needs GYN - states cause its painful Pt denies family hx of cancers - breast, ovarian, uterine, colon:     Osteoporosis:   Discussion on osteoporosis per age, including high calcium and vitamin D supplementation, weight bearing exercises  Skin cancer:  Hx of skin CA -  NO Discussed atypical lesions   Colorectal cancer:   Colonoscopy is due oct 2022 Discussed concerning signs and sx of CRC, pt denies any concerning sx  Lung cancer:   Low Dose CT Chest recommended if Age 65-80years, 20 pack-year currently smoking OR have quit w/in 15years. Patient does not qualify.    Social History   Tobacco Use   Smoking status: Never   Smokeless tobacco: Never  Vaping Use   Vaping Use: Never used  Substance Use Topics   Alcohol use: No    Alcohol/week: 0.0 standard drinks   Drug use: No     Flowsheet Row [facility] 0       Family History  Problem Relation Age of Onset   Von Willebrand disease Mother    Osteoporosis Mother    Prostate cancer Brother    Bladder Cancer Maternal Grandfather      Blood pressure/Hypertension: BP Readings from Last 3 Encounters:  04/06/21 (!) 102/58  02/21/21 126/78  02/02/21 120/70    Weight/Obesity: Wt Readings from Last 3 Encounters:  04/06/21 93 lb 14.4 oz (42.6 kg)  02/21/21  92 lb (41.7 kg)  02/02/21 92 lb 8 oz (42 kg)   BMI Readings from Last 3 Encounters:  04/06/21 16.12 kg/m  02/21/21 15.79 kg/m  02/02/21 15.88 kg/m     Lipids:  Lab Results  Component Value Date   CHOL 170 02/21/2021   CHOL 182 01/06/2018   Lab Results  Component Value Date   HDL 71 02/21/2021   HDL 71 01/06/2018   Lab Results  Component Value Date   LDLCALC 80 02/21/2021   LDLCALC 94 01/06/2018   Lab Results  Component Value Date   TRIG 103 02/21/2021   TRIG 77 01/06/2018   Lab Results  Component Value Date   CHOLHDL 2.4 02/21/2021   CHOLHDL 2.6 01/06/2018   No results found for: LDLDIRECT Based on the results of lipid panel his/her cardiovascular risk factor ( using [facility] )  in the next 10 years is: The 10-year ASCVD risk score (Arnett DK, et al., 2019) is: 3%   Values used to calculate the score:     Age: 3 years     Sex: Female     Is Non-Hispanic African American: No     Diabetic: No     Tobacco smoker: No     Systolic Blood Pressure: 099 mmHg     Is BP treated: No     HDL Cholesterol: 71 mg/dL     Total Cholesterol: 170 mg/dL  Glucose:  Glucose, Bld  Date Value Ref Range Status  02/21/2021 64 (L) 65 - 99 mg/dL Final    Comment:    .            Fasting reference interval .   05/06/2020 100 (H) 70 - 99 mg/dL Final    Comment:    Glucose reference range applies only to samples taken after fasting for at least 8 hours.  12/23/2019 77 65 - 99 mg/dL Final    Comment:    .            Fasting reference interval .     Advanced Care Planning:  A voluntary discussion about advance care planning including the explanation and discussion of advance directives.   Discussed health care proxy and Living will, and the patient was able to identify a health care proxy as husband.     Social History       Social History   Socioeconomic History   Marital status: Married    Spouse name: Gwenlyn Perking   Number of children: 2   Years of education: Not  on file   Highest education level: Not on file  Occupational History   Not on file  Tobacco Use   Smoking status: Never   Smokeless tobacco: Never  Vaping Use   Vaping Use: Never used  Substance and Sexual Activity   Alcohol use: No    Alcohol/week: 0.0 standard drinks   Drug use: No   Sexual activity: Yes    Partners: Male    Birth control/protection: Post-menopausal  Other Topics Concern   Not on file  Social History Narrative   Not on file   Social Determinants of Health   Financial Resource Strain: Low Risk    Difficulty of Paying Living Expenses: Not hard at all  Food Insecurity: No Food Insecurity   Worried About Charity fundraiser in the Last Year: Never true   [place] in the Last Year: Never true  Transportation Needs: No Transportation Needs   Lack of Transportation (Medical): No   Lack of Transportation (Non-Medical): No  Physical Activity: Insufficiently Active   Days of Exercise per Week: 3 days   Minutes of Exercise per Session: 30 min  Stress: No Stress Concern Present   Feeling of Stress : Only a little  Social Connections: Unknown   Frequency of Communication with Friends and Family: More than three times a week   Frequency of Social Gatherings with Friends and Family:  More than three times a week   Attends Religious Services: Patient refused   Active Member of Clubs or Organizations: Yes   Attends Archivist Meetings: 1 to 4 times per year   Marital Status: Married    Family History        Family History  Problem Relation Age of Onset   Von Willebrand disease Mother    Osteoporosis Mother    Prostate cancer Brother    Bladder Cancer Maternal Grandfather     Patient Active Problem List   Diagnosis Date Noted   Unintentional weight loss of 5% body weight or less within 1 month 02/21/2021   Protein-calorie malnutrition ([place]) 02/21/2021   Anserine bursitis 03/07/2020   Venous stasis dermatitis of right lower extremity  10/06/2019   Preventative health care 05/05/2018   Spider vein of lower extremity 01/06/2018   Chronic bronchitis with acute exacerbation ([place]) 06/11/2017   Chronic bronchitis ([place]) 06/11/2017   Disorder of uvula 08/22/2016   Nodule of finger of both hands 09/19/2015   Pharyngeal disorder 09/19/2015   Sun-induced skin changes, keratosis 03/22/2015   LBP (low back pain) 01/27/2015   Osteoporosis 01/27/2015   Varicose veins of leg with pain, bilateral 01/27/2015   Allergic rhinitis, seasonal 01/27/2015   RAD (reactive airway disease) 01/27/2015    Past Surgical History:  Procedure Laterality Date   COLONOSCOPY WITH PROPOFOL N/A 05/20/2018   Procedure: COLONOSCOPY WITH PROPOFOL;  Surgeon: Jonathon Bellows, MD;  Location: [facility] ENDOSCOPY;  Service: Gastroenterology;  Laterality: N/A;   TONSILLECTOMY AND ADENOIDECTOMY  04/22/2007     Current Outpatient Medications:    albuterol (PROVENTIL HFA;VENTOLIN HFA) 108 (90 Base) MCG/ACT inhaler, Inhale 2 puffs every 4 (four) hours as needed into the lungs for wheezing or shortness of breath., Disp: 1 Inhaler, Rfl: 1   alendronate (FOSAMAX) 70 MG tablet, Take 1 tablet (70 mg total) by mouth every 7 (seven) days. Take with a full glass of water on an empty stomach., Disp: 4 tablet, Rfl: 11   b complex vitamins capsule, Take 1 capsule by mouth daily., Disp: , Rfl:    calcium carbonate (OS-CAL) 600 MG TABS tablet, Take 500 mg by mouth 2 (two) times daily. , Disp: , Rfl:    cholecalciferol (VITAMIN D) 1000 units tablet, Take 1,000 Units by mouth daily., Disp: , Rfl:    fluticasone (FLONASE) 50 MCG/ACT nasal spray, Place 2 sprays as needed into both nostrils., Disp: 16 g, Rfl: 11   levocetirizine (XYZAL) 5 MG tablet, Take 5 mg by mouth as needed for allergies., Disp: , Rfl:    magnesium oxide (MAG-OX) 400 MG tablet, Take 400 mg by mouth daily., Disp: , Rfl:    MULTIPLE VITAMINS PO, Take daily by mouth. , Disp: , Rfl:    vitamin C (ASCORBIC ACID) 500 MG  tablet, Take 500 mg by mouth daily., Disp: , Rfl:    zinc gluconate 50 MG tablet, Take 50 mg by mouth daily., Disp: , Rfl:   Allergies  Allergen Reactions   Breo Ellipta [Fluticasone Furoate-Vilanterol]     Patient Care Team: Delsa Grana, PA-C as PCP - General (Family Medicine)   Chart Review: I personally reviewed active problem list, medication list, allergies, family history, social history, health maintenance, notes from last encounter, lab results, imaging with the patient/caregiver today.   Review of Systems  Constitutional: Negative.   HENT: Negative.    Eyes: Negative.   Respiratory: Negative.    Cardiovascular: Negative.   Gastrointestinal: Negative.  Endocrine: Negative.   Genitourinary: Negative.   Musculoskeletal: Negative.   Skin: Negative.   Allergic/Immunologic: Negative.   Neurological: Negative.   Hematological: Negative.   Psychiatric/Behavioral: Negative.    All other systems reviewed and are negative.        Objective:   Vitals:  Vitals:   04/06/21 0838  BP: (!) 102/58  Pulse: (!) 55  Resp: 16  Temp: 97.6 F (36.4 C)  SpO2: 98%  Weight: 93 lb 14.4 oz (42.6 kg)  Height: _0  (1.626 m)    Body mass index is 16.12 kg/m.  Physical Exam Vitals and nursing note reviewed.  Constitutional:      General: She is not in acute distress.    Appearance: Normal appearance. She is well-developed and underweight. She is not ill-appearing, toxic-appearing or diaphoretic.     Interventions: Face mask in place.  HENT:     Head: Normocephalic and atraumatic.     Right Ear: Tympanic membrane, ear canal and external ear normal.     Left Ear: Tympanic membrane, ear canal and external ear normal.  Eyes:     General: Lids are normal. No scleral icterus.       Right eye: No discharge.        Left eye: No discharge.     Conjunctiva/sclera: Conjunctivae normal.  Neck:     Trachea: Phonation normal. No tracheal deviation.  Cardiovascular:     Rate and  Rhythm: Regular rhythm. Bradycardia present.     Pulses: Normal pulses.          Radial pulses are 2+ on the right side and 2+ on the left side.       Posterior tibial pulses are 2+ on the right side and 2+ on the left side.     Heart sounds: Normal heart sounds. No murmur heard.   No friction rub. No gallop.  Pulmonary:     Effort: Pulmonary effort is normal. No respiratory distress.     Breath sounds: Normal breath sounds. No stridor. No wheezing, rhonchi or rales.  Chest:     Chest wall: No tenderness.  Abdominal:     General: Bowel sounds are normal. There is no distension.     Palpations: Abdomen is soft.  Musculoskeletal:     Right lower leg: No edema.     Left lower leg: No edema.  Skin:    General: Skin is warm and dry.     Coloration: Skin is not jaundiced or pale.     Findings: No rash.  Neurological:     Mental Status: She is alert.     Motor: No abnormal muscle tone.     Gait: Gait normal.  Psychiatric:        Attention and Perception: Attention normal.        Mood and Affect: Mood is anxious. Mood is not depressed.        Speech: Speech normal.        Behavior: Behavior normal. Behavior is cooperative.        Thought Content: Thought content normal.     Comments: Fidgety, slightly hyperactive      Fall Risk: Fall Risk  04/06/2021 02/21/2021 11/21/2020 03/07/2020 12/23/2019  Falls in the past year? 0 0 0 0 0  Number falls in past yr: 0 0 0 0 0  Injury with Fall? 0 0 0 0 0    Functional Status Survey: Is the patient deaf or have difficulty hearing?: No Does the  patient have difficulty seeing, even when wearing glasses/contacts?: No Does the patient have difficulty concentrating, remembering, or making decisions?: No Does the patient have difficulty walking or climbing stairs?: No Does the patient have difficulty dressing or bathing?: No Does the patient have difficulty doing errands alone such as visiting a doctor's office or shopping?: No   Assessment & Plan:     CPE completed today  USPSTF grade A and B recommendations reviewed with patient; age-appropriate recommendations, preventive care, screening tests, etc discussed and encouraged; healthy living encouraged; see AVS for patient education given to patient  Discussed importance of 150 minutes of physical activity weekly, AHA exercise recommendations given to pt in AVS/handout  Discussed importance of healthy diet:  eating lean meats and proteins, avoiding trans fats and saturated fats, avoid simple sugars and excessive carbs in diet, eat 6 servings of fruit/vegetables daily and drink plenty of water and avoid sweet beverages.    Recommended pt to do annual eye exam and routine dental exams/cleanings  Depression, alcohol, fall screening completed as documented above and per flowsheets  Advance Care planning information and packet discussed and offered today, encouraged pt to discuss with family members/spouse/partner/friends and complete Advanced directive packet and bring copy to office   Reviewed Health Maintenance: Health Maintenance  Topic Date Due   PNA vac Low Risk Adult (1 of 2 - PCV13) 04/04/2021   PAP SMEAR-Modifier  04/22/2021   INFLUENZA VACCINE  04/09/2021 (Originally 02/20/2021)   COVID-19 Vaccine (4 - Booster for [employer] series) 04/22/2021 (Originally 11/14/2020)   Zoster Vaccines- Shingrix (1 of 2) 05/24/2021 (Originally 04/05/1975)   MAMMOGRAM  04/06/2022 (Originally 10/05/2018)   COLONOSCOPY (Pts 45-69yr Insurance coverage will need to be confirmed)  05/20/2021   DEXA SCAN  03/22/2022   TETANUS/TDAP  01/07/2028   Hepatitis C Screening  Completed   HIV Screening  Completed   HPV VACCINES  Aged Out    Immunizations: Immunization History  Administered Date(s) Administered   PFIZER Comirnaty(Gray Top)Covid-19 Tri-Sucrose Vaccine 08/22/2020   PFIZER(Purple Top)SARS-COV-2 Vaccination 12/05/2019, 12/29/2019   Tdap 04/22/2007, 01/06/2018   Tetanus 04/22/2007   Vaccines:   HPV: up to at age 65, ask insurance if age between 270-45 Shingrix: 517-64yo and ask insurance if covered when patient above 654yo Pneumonia:  educated and discussed with patient. Flu:  educated and discussed with patient.    ICD-10-CM   1. Annual physical exam  Z00.00    lab done at last appt, reviewed results today at length with patient    2. Screening for cervical cancer  Z12.4    refused    3. Protein[place]calorie malnutrition, unspecified severity ([place]  E46    reviewed calorie and protein intake needs and info printed and given          LDelsa Grana PA-C 04/06/21 9:02 AM  [facility]

## 2021-04-06 NOTE — Patient Instructions (Addendum)
Health Maintenance  Topic Date Due   Pneumonia vaccines (1 of 2 - PCV13) 04/04/2021   Pap Smear  04/22/2021   Flu Shot  04/09/2021*   COVID-19 Vaccine (4 - Booster for Pfizer series) 04/22/2021*   Zoster (Shingles) Vaccine (1 of 2) 05/24/2021*   Mammogram  04/06/2022*   Colon Cancer Screening  05/20/2021   DEXA scan (bone density measurement)  03/22/2022   Tetanus Vaccine  01/07/2028   Hepatitis C Screening: USPSTF Recommendation to screen - Ages 18-65 yo.  Completed   HIV Screening  Completed   HPV Vaccine  Aged Out  *Topic was postponed. The date shown is not the original due date.   [facility] [place],  [place]  [id number] Get Driving Directions Main: [phone]  High-Protein and High-Calorie Diet Eating high-protein and high-calorie foods can help you to gain weight, heal after an injury, and recover after an illness or surgery. The specific amount of daily protein and calories you need depends on: Your body weight. The reason this diet is recommended for you. Generally, a high-protein, high-calorie diet involves: Eating 250-500 extra calories each day. Making sure that you get enough of your daily calories from protein. Ask your health care provider how many of your calories should come from protein. Talk with a health care provider or a dietitian about how much protein and how many calories you need each day. Follow the diet as directed by your health care provider. What are tips for following this plan? Reading food labels Check the nutrition facts label for calories, grams of fat and protein. Items with more than 4 grams of protein are high-protein foods. Preparing meals Add whole milk, half-and-half, or heavy cream to cereal, pudding, soup, or hot cocoa. Add whole milk to instant breakfast drinks. Add peanut butter to oatmeal or smoothies. Add powdered milk to baked goods, smoothies, or milkshakes. Add powdered milk,  cream, or butter to mashed potatoes. Add cheese to cooked vegetables. Make whole-milk yogurt parfaits. Top them with granola, fruit, or nuts. Add cottage cheese to fruit. Add avocado, cheese, or both to sandwiches or salads. Add avocado to smoothies. Add meat, poultry, or seafood to rice, pasta, casseroles, salads, and soups. Use mayonnaise when making egg salad, chicken salad, or tuna salad. Use peanut butter as a dip for fruits and vegetables or as a topping for pretzels, celery, or crackers. Add beans to casseroles, dips, and spreads. Add pureed beans to sauces and soups. Replace calorie-free drinks with calorie-containing drinks, such as milk and fruit juice. Replace water with milk or heavy cream when making foods such as oatmeal, pudding, or cocoa. Add oil or butter to cooked vegetables and grains. Add cream cheese to sandwiches or as a topping on crackers and bread. Make cream-based pastas and soups. General information Ask your health care provider if you should take a nutritional supplement. Try to eat six small meals each day instead of three large meals. A general goal is to eat every 2 to 3 hours. Eat a balanced diet. In each meal, include one food that is high in protein and one food with fat in it. Keep nutritious snacks available, such as nuts, trail mixes, dried fruit, and yogurt. If you have kidney disease or diabetes, talk with your health care provider about how much protein is safe for you. Too much protein may put extra stress on your kidneys. Drink your calories. Choose high-calorie drinks and have them after your  meals. Consider setting a timer to remind you to eat. You will want to eat even if you do not feel very hungry. What high-protein foods should I eat? Vegetables Soybeans. Peas. Grains Quinoa. Bulgur wheat. Buckwheat. Meats and other proteins Beef, pork, and poultry. Fish and seafood. Eggs. Tofu. Textured vegetable protein (TVP). Peanut butter. Nuts and  seeds. Dried beans. Protein powders. Hummus. Dairy Whole milk. Whole-milk yogurt. Powdered milk. Cheese. Yahoo. Eggnog. Beverages High-protein supplement drinks. Soy milk. Other foods Protein bars. The items listed above may not be a complete list of foods and beverages you can eat and drink. Contact a dietitian for more information. What high-calorie foods should I eat? Fruits Dried fruit. Fruit leather. Canned fruit in syrup. Fruit juice. Avocado. Vegetables Vegetables cooked in oil or butter. Fried potatoes. Grains Pasta. Quick breads. Muffins. Pancakes. Ready-to-eat cereal. Meats and other proteins Peanut butter. Nuts and seeds. Dairy Heavy cream. Whipped cream. Cream cheese. Sour cream. Ice cream. Custard. Pudding. Whole milk dairy products. Beverages Meal-replacement beverages. Nutrition shakes. Fruit juice. Seasonings and condiments Salad dressing. Mayonnaise. Alfredo sauce. Fruit preserves or jelly. Honey. Syrup. Sweets and desserts Cake. Cookies. Pie. Pastries. Candy bars. Chocolate. Fats and oils Butter or margarine. Oil. Gravy. Other foods Meal-replacement bars. The items listed above may not be a complete list of foods and beverages you can eat and drink. Contact a dietitian for more information. Summary A high-protein, high-calorie diet can help you gain weight or heal faster after an injury, illness, or surgery. To increase your protein and calories, add ingredients such as whole milk, peanut butter, cheese, beans, meat, or seafood to meal items. To get enough extra calories each day, include high-calorie foods and beverages at each meal. Adding a high-calorie drink or shake can be an easy way to help you get enough calories each day. Talk with your healthcare provider or dietitian about the best options for you. This information is not intended to replace advice given to you by your health care provider. Make sure you discuss any questions you have with your  health care provider. Document Revised: 06/12/2020 Document Reviewed: 06/12/2020 Elsevier Patient Education  2022 Elsevier Inc.    Preventive Care 62-28 Years Old, Female Preventive care refers to lifestyle choices and visits with your health care provider that can promote health and wellness. This includes: A yearly physical exam. This is also called an annual wellness visit. Regular dental and eye exams. Immunizations. Screening for certain conditions. Healthy lifestyle choices, such as: Eating a healthy diet. Getting regular exercise. Not using drugs or products that contain nicotine and tobacco. Limiting alcohol use. What can I expect for my preventive care visit? Physical exam Your health care provider will check your: Height and weight. These may be used to calculate your BMI (body mass index). BMI is a measurement that tells if you are at a healthy weight. Heart rate and blood pressure. Body temperature. Skin for abnormal spots. Counseling Your health care provider may ask you questions about your: Past medical problems. Family's medical history. Alcohol, tobacco, and drug use. Emotional well-being. Home life and relationship well-being. Sexual activity. Diet, exercise, and sleep habits. Work and work Statistician. Access to firearms. Method of birth control. Menstrual cycle. Pregnancy history. What immunizations do I need? Vaccines are usually given at various ages, according to a schedule. Your health care provider will recommend vaccines for you based on your age, medical history, and lifestyle or other factors, such as travel or where you work. What tests  do I need? Blood tests Lipid and cholesterol levels. These may be checked every 5 years, or more often if you are over 84 years old. Hepatitis C test. Hepatitis B test. Screening Lung cancer screening. You may have this screening every year starting at age 64 if you have a 30-pack-year history of smoking and  currently smoke or have quit within the past 15 years. Colorectal cancer screening. All adults should have this screening starting at age 59 and continuing until age 30. Your health care provider may recommend screening at age 26 if you are at increased risk. You will have tests every 1-10 years, depending on your results and the type of screening test. Diabetes screening. This is done by checking your blood sugar (glucose) after you have not eaten for a while (fasting). You may have this done every 1-3 years. Mammogram. This may be done every 1-2 years. Talk with your health care provider about when you should start having regular mammograms. This may depend on whether you have a family history of breast cancer. BRCA-related cancer screening. This may be done if you have a family history of breast, ovarian, tubal, or peritoneal cancers. Pelvic exam and Pap test. This may be done every 3 years starting at age 20. Starting at age 20, this may be done every 5 years if you have a Pap test in combination with an HPV test. Other tests STD (sexually transmitted disease) testing, if you are at risk. Bone density scan. This is done to screen for osteoporosis. You may have this scan if you are at high risk for osteoporosis. Talk with your health care provider about your test results, treatment options, and if necessary, the need for more tests. Follow these instructions at home: Eating and drinking  Eat a diet that includes fresh fruits and vegetables, whole grains, lean protein, and low-fat dairy products. Take vitamin and mineral supplements as recommended by your health care provider. Do not drink alcohol if: Your health care provider tells you not to drink. You are pregnant, may be pregnant, or are planning to become pregnant. If you drink alcohol: Limit how much you have to 0-1 drink a day. Be aware of how much alcohol is in your drink. In the [place] one drink equals one 12 oz bottle of beer  (355 mL), one 5 oz glass of wine (148 mL), or one 1 oz glass of hard liquor (44 mL). Lifestyle Take daily care of your teeth and gums. Brush your teeth every morning and night with fluoride toothpaste. Floss one time each day. Stay active. Exercise for at least 30 minutes 5 or more days each week. Do not use any products that contain nicotine or tobacco, such as cigarettes, e-cigarettes, and chewing tobacco. If you need help quitting, ask your health care provider. Do not use drugs. If you are sexually active, practice safe sex. Use a condom or other form of protection to prevent STIs (sexually transmitted infections). If you do not wish to become pregnant, use a form of birth control. If you plan to become pregnant, see your health care provider for a prepregnancy visit. If told by your health care provider, take low-dose aspirin daily starting at age 9. Find healthy ways to cope with stress, such as: Meditation, yoga, or listening to music. Journaling. Talking to a trusted person. Spending time with friends and family. Safety Always wear your seat belt while driving or riding in a vehicle. Do not drive: If you have been drinking alcohol.  Do not ride with someone who has been drinking. When you are tired or distracted. While texting. Wear a helmet and other protective equipment during sports activities. If you have firearms in your house, make sure you follow all gun safety procedures. What's next? Visit your health care provider once a year for an annual wellness visit. Ask your health care provider how often you should have your eyes and teeth checked. Stay up to date on all vaccines. This information is not intended to replace advice given to you by your health care provider. Make sure you discuss any questions you have with your health care provider. Document Revised: 09/16/2020 Document Reviewed: 03/20/2018 Elsevier Patient Education  2022 [employer].

## 2021-05-18 ENCOUNTER — Institutional Professional Consult (permissible substitution): Payer: 59 | Admitting: Internal Medicine

## 2021-06-01 ENCOUNTER — Encounter: Payer: Self-pay | Admitting: Family Medicine

## 2021-06-01 ENCOUNTER — Institutional Professional Consult (permissible substitution): Payer: 59 | Admitting: Internal Medicine

## 2021-07-27 ENCOUNTER — Ambulatory Visit: Payer: 59 | Admitting: Family Medicine

## 2021-08-03 ENCOUNTER — Encounter: Payer: Self-pay | Admitting: Nurse Practitioner

## 2021-08-03 ENCOUNTER — Ambulatory Visit (INDEPENDENT_AMBULATORY_CARE_PROVIDER_SITE_OTHER): Payer: Medicare HMO | Admitting: Nurse Practitioner

## 2021-08-03 VITALS — BP 120/62 | HR 83 | Temp 97.6°F | Resp 16 | Ht 64.0 in | Wt 94.9 lb

## 2021-08-03 DIAGNOSIS — E162 Hypoglycemia, unspecified: Secondary | ICD-10-CM | POA: Diagnosis not present

## 2021-08-03 DIAGNOSIS — D72819 Decreased white blood cell count, unspecified: Secondary | ICD-10-CM

## 2021-08-03 DIAGNOSIS — R634 Abnormal weight loss: Secondary | ICD-10-CM

## 2021-08-03 DIAGNOSIS — Z130 Encounter for screening for diseases of the blood and blood-forming organs and certain disorders involving the immune mechanism: Secondary | ICD-10-CM

## 2021-08-03 LAB — COMPLETE METABOLIC PANEL WITH GFR
AG Ratio: 1.5 (calc) (ref 1.0–2.5)
ALT: 15 U/L (ref 6–29)
AST: 18 U/L (ref 10–35)
Albumin: 4.1 g/dL (ref 3.6–5.1)
Alkaline phosphatase (APISO): 55 U/L (ref 37–153)
BUN: 21 mg/dL (ref 7–25)
CO2: 32 mmol/L (ref 20–32)
Calcium: 9.1 mg/dL (ref 8.6–10.4)
Chloride: 106 mmol/L (ref 98–110)
Creat: 0.68 mg/dL (ref 0.50–1.05)
Globulin: 2.7 g/dL (calc) (ref 1.9–3.7)
Glucose, Bld: 64 mg/dL — ABNORMAL LOW (ref 65–99)
Potassium: 3.9 mmol/L (ref 3.5–5.3)
Sodium: 143 mmol/L (ref 135–146)
Total Bilirubin: 0.5 mg/dL (ref 0.2–1.2)
Total Protein: 6.8 g/dL (ref 6.1–8.1)
eGFR: 97 mL/min/{1.73_m2} (ref >=60)

## 2021-08-03 LAB — CBC WITH DIFFERENTIAL/PLATELET
Absolute Monocytes: 413 cells/uL (ref 200–950)
Basophils Absolute: 30 cells/uL (ref 0–200)
Basophils Relative: 0.7 %
Eosinophils Absolute: 112 cells/uL (ref 15–500)
Eosinophils Relative: 2.6 %
HCT: 39.1 % (ref 35.0–45.0)
Hemoglobin: 13.2 g/dL (ref 11.7–15.5)
Lymphs Abs: 688 cells/uL — ABNORMAL LOW (ref 850–3900)
MCH: 33.4 pg — ABNORMAL HIGH (ref 27.0–33.0)
MCHC: 33.8 g/dL (ref 32.0–36.0)
MCV: 99 fL (ref 80.0–100.0)
MPV: 10.7 fL (ref 7.5–12.5)
Monocytes Relative: 9.6 %
Neutro Abs: 3057 cells/uL (ref 1500–7800)
Neutrophils Relative %: 71.1 %
Platelets: 268 10*3/uL (ref 140–400)
RBC: 3.95 10*6/uL (ref 3.80–5.10)
RDW: 11.8 % (ref 11.0–15.0)
Total Lymphocyte: 16 %
WBC: 4.3 10*3/uL (ref 3.8–10.8)

## 2021-08-03 NOTE — Progress Notes (Signed)
BP 120/62    Pulse 83    Temp 97.6 F (36.4 C) (Oral)    Resp 16    Ht 5' 4"  (1.626 m)    Wt 94 lb 14.4 oz (43 kg)    SpO2 99%    BMI 16.29 kg/m    Subjective:    Patient ID: Kristina Christian, female    DOB: [birth year], 66 y.o.   MRN: [id number]  HPI: Kristina Christian is a 66 y.o. female  Chief Complaint  Patient presents with   Follow-up   Weight Check   Weight loss: She says that she used to weight 102 lbs in 2019.  She says after she got pneumonia last year she lost about five pounds.  She says she has been working on gaining weight.  Last visit her weight was 93 lbs it is currently 94 lbs.  She says she is trying to care for herself however she says it is hard because she is caring for an elderly parent. 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  She says she has been drinking boost but she says she stretches it over a couple days due to cost.  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  She says she feels great and does not feel weak or dizzy.  Will get labs to check her WBC and glucose because they were low last time.   Relevant past medical, surgical, family and social history reviewed and updated as indicated. Interim medical history since our last visit reviewed. Allergies and medications reviewed and updated.  Review of Systems  Constitutional: Negative for fever, positive for weight change.  Respiratory: Negative for cough and shortness of breath.   Cardiovascular: Negative for chest pain or palpitations.  Gastrointestinal: Negative for abdominal pain, no bowel changes.  Musculoskeletal: Negative for gait problem or joint swelling.  Skin: Negative for rash.  Neurological: Negative for dizziness or headache.  No other specific complaints in a complete review of systems (except as listed in HPI above).       Objective:    BP 120/62    Pulse 83    Temp 97.6 F (36.4 C) (Oral)    Resp 16    Ht 5' 4"  (1.626 m)    Wt 94 lb 14.4 oz (43 kg)    SpO2 99%    BMI 16.29 kg/m   Wt Readings from Last 3 Encounters:  08/03/21 94 lb 14.4 oz (43 kg)  04/06/21 93 lb 14.4 oz (42.6 kg)  02/21/21 92 lb (41.7 kg)    Physical Exam  Constitutional: Patient appears well-developed and well-nourished. Underweight No distress.  HEENT: head atraumatic, normocephalic, pupils equal and reactive to light,  neck supple Cardiovascular: Normal rate, regular rhythm and normal heart sounds.  No murmur heard. No BLE edema. Pulmonary/Chest: Effort normal and breath sounds normal. No respiratory distress. Abdominal: Soft.  There is no tenderness. Psychiatric: Patient has a normal mood and affect. behavior is normal. Judgment and thought content normal.   Results for orders placed or performed in visit on 02/21/21  COMPLETE METABOLIC PANEL WITH GFR  Result Value Ref Range   Glucose, Bld 64 (L) 65 - 99 mg/dL   BUN 17 7 - 25 mg/dL   Creat 0.71 0.50 - 1.05  mg/dL   eGFR 95 > OR = 60 mL/min/1.75m   BUN/Creatinine Ratio NOT APPLICABLE 6 - 22 (calc)   Sodium 141 135 - 146 mmol/L   Potassium 4.2 3.5 - 5.3 mmol/L   Chloride 104 98 - 110 mmol/L   CO2 29 20 - 32 mmol/L   Calcium 9.6 8.6 - 10.4 mg/dL   Total Protein 7.2 6.1 - 8.1 g/dL   Albumin 4.7 3.6 - 5.1 g/dL   Globulin 2.5 1.9 - 3.7 g/dL (calc)   AG Ratio 1.9 1.0 - 2.5 (calc)   Total Bilirubin 0.7 0.2 - 1.2 mg/dL   Alkaline phosphatase (APISO) 57 37 - 153 U/L   AST 20 10 - 35 U/L   ALT 18 6 - 29 U/L  Lipid panel  Result Value Ref Range   Cholesterol 170 <200 mg/dL   HDL 71 > OR = 50 mg/dL   Triglycerides 103 <150 mg/dL   LDL Cholesterol (Calc) 80 mg/dL (calc)   Total CHOL/HDL Ratio 2.4 <5.0 (calc)   Non-HDL Cholesterol (Calc) 99 <130 mg/dL (calc)  CBC w/Diff/Platelet  Result Value Ref Range   WBC 3.2 (L) 3.8 - 10.8 Thousand/uL   RBC 4.02 3.80 - 5.10 Million/uL    Hemoglobin 13.2 11.7 - 15.5 g/dL   HCT 39.8 35.0 - 45.0 %   MCV 99.0 80.0 - 100.0 fL   MCH 32.8 27.0 - 33.0 pg   MCHC 33.2 32.0 - 36.0 g/dL   RDW 11.7 11.0 - 15.0 %   Platelets 217 140 - 400 Thousand/uL   MPV 10.9 7.5 - 12.5 fL   Neutro Abs 1,798 1,500 - 7,800 cells/uL   Lymphs Abs 922 850 - 3,900 cells/uL   Absolute Monocytes 419 200 - 950 cells/uL   Eosinophils Absolute 42 15 - 500 cells/uL   Basophils Absolute 19 0 - 200 cells/uL   Neutrophils Relative % 56.2 %   Total Lymphocyte 28.8 %   Monocytes Relative 13.1 %   Eosinophils Relative 1.3 %   Basophils Relative 0.6 %  TSH  Result Value Ref Range   TSH 1.27 0.40 - 4.50 mIU/L  HIV antibody (with reflex)  Result Value Ref Range   HIV 1&2 Ab, 4th Generation NON-REACTIVE NON-REACTIVE  Hepatitis C Antibody  Result Value Ref Range   Hepatitis C Ab NON-REACTIVE NON-REACTIVE   SIGNAL TO CUT-OFF 0.01 <1.00  T4  Result Value Ref Range   T4, Total 9.7 5.1 - 11.9 mcg/dL  Parathyroid hormone, intact (no Ca)  Result Value Ref Range   PTH 30 16 - 77 pg/mL  VITAMIN D 25 Hydroxy (Vit-D Deficiency, Fractures)  Result Value Ref Range   Vit D, 25-Hydroxy 73 30 - 100 ng/mL      Assessment & Plan:   1. Unintentional weight loss of 5% body weight or less within 1 month -continue increasing protein in diet -continue to use Boost  2. Leukopenia, unspecified type  - CBC with Differential/Platelet  3. Screening for deficiency anemia  - CBC with Differential/Platelet  4. Hypoglycemia  - COMPLETE METABOLIC PANEL WITH GFR   Follow up plan: Return in about 6 months (around 01/31/2022) for follow up.

## 2021-09-28 ENCOUNTER — Encounter: Payer: Self-pay | Admitting: Family Medicine

## 2021-09-29 ENCOUNTER — Other Ambulatory Visit: Payer: Self-pay | Admitting: Family Medicine

## 2021-09-29 DIAGNOSIS — M810 Age-related osteoporosis without current pathological fracture: Secondary | ICD-10-CM

## 2021-11-27 ENCOUNTER — Telehealth: Payer: Self-pay

## 2021-11-27 DIAGNOSIS — Z8701 Personal history of pneumonia (recurrent): Secondary | ICD-10-CM

## 2021-11-27 DIAGNOSIS — J42 Unspecified chronic bronchitis: Secondary | ICD-10-CM

## 2021-11-27 NOTE — Telephone Encounter (Signed)
Does she need appt or can we place another referral ?

## 2021-11-28 ENCOUNTER — Other Ambulatory Visit: Payer: Self-pay

## 2021-11-28 NOTE — Telephone Encounter (Signed)
Copied from Max. Topic: Referral - Request for Referral ?>> Nov 27, 2021  3:40 PM Alanda Slim E wrote: ?Has patient seen PCP for this complaint? Yes  ?*If NO, is insurance requiring patient see PCP for this issue before PCP can refer them? ?Referral for which specialty: Pulmonologist  ?Preferred provider/office: [facility] in [place] ?Reason for referral: Pt wasn't able to use the last referral due to covid and other issues / pt needs new referral sent to see them ?

## 2021-11-29 NOTE — Addendum Note (Signed)
Addended by: Delsa Grana on: 11/29/2021 06:10 PM ? ? Modules accepted: Orders ? ?

## 2021-12-05 ENCOUNTER — Other Ambulatory Visit: Payer: Self-pay

## 2021-12-05 ENCOUNTER — Ambulatory Visit: Payer: Medicare HMO | Attending: Internal Medicine

## 2021-12-05 DIAGNOSIS — Z23 Encounter for immunization: Secondary | ICD-10-CM

## 2021-12-05 MED ORDER — PFIZER COVID-19 VAC BIVALENT 30 MCG/0.3ML IM SUSP
INTRAMUSCULAR | 0 refills | Status: DC
  Filled 2021-12-05: qty 0.3, 1d supply, fill #0

## 2021-12-05 NOTE — Progress Notes (Signed)
? ?  Covid-19 Vaccination Clinic ? ?Name:  Kristina Christian    ?MRN: [id number] ?DOB: 20-Aug-1955 ? ?12/05/2021 ? ?Ms. Mondry was observed post Covid-19 immunization for 15 minutes without incident. She was provided with Vaccine Information Sheet and instruction to access the V-Safe system.  ? ?[name] was instructed to call 911 with any severe reactions post vaccine: ?Difficulty breathing  ?Swelling of face and throat  ?A fast heartbeat  ?A bad rash all over body  ?Dizziness and weakness  ? ?Immunizations Administered   ? ? Name Date Dose VIS Date Route  ? Ambulance person Booster 12/05/2021 10:14 AM 0.3 mL 03/22/2021 Intramuscular  ? Manufacturer: [place]: [phone]  ? [place]: [id number]  ? ?  ? ? ?Covid-19 Vaccination Clinic ? ?Name:  Kristina Christian    ?MRN: [id number] ?DOB: 11-Mar-1956 ? ?12/05/2021 ? ?[name] was observed post Covid-19 immunization for 15 minutes without incident. She was provided with Vaccine Information Sheet and instruction to access the V-Safe system.  ? ?[name] was instructed to call 911 with any severe reactions post vaccine: ?Difficulty breathing  ?Swelling of face and throat  ?A fast heartbeat  ?A bad rash all over body  ?Dizziness and weakness  ? ?Immunizations Administered   ? ? Name Date Dose VIS Date Route  ? Ambulance person Booster 12/05/2021 10:14 AM 0.3 mL 03/22/2021 Intramuscular  ? Manufacturer: [place]: [phone]  ? [place]: [id number]  ? ?  ? ?

## 2021-12-12 ENCOUNTER — Ambulatory Visit: Payer: Medicare HMO | Admitting: Internal Medicine

## 2021-12-12 ENCOUNTER — Encounter: Payer: Self-pay | Admitting: Internal Medicine

## 2021-12-12 DIAGNOSIS — R058 Other specified cough: Secondary | ICD-10-CM | POA: Insufficient documentation

## 2021-12-12 NOTE — Assessment & Plan Note (Addendum)
Onset 1991 p moved to present residence twice yearly with "weather change" always starting with nasal symptoms then progressing to pnds and purulent sputum production ? pna   Between the acute episodes feels fine and no flare since the year of covid 19 so no no need for anything but otc H1s and nasal saline per pt   rec Continue saline rinses/ prn xyzal and if continues to have nasal flares not well controlled > allergy eval next step  Ok to use saba short term if finds helpful for cough or breathing but carefully reviewed >>> - The proper method of use, as well as anticipated side effects, of a metered-dose inhaler were discussed and demonstrated to the patient using teach back method.  >>> rule of two to avoid over use and offer a more effective short term rx eg symbicort 80 prn Based on two studies from NEJM  378; 20 p 1865 (2018) and 380 : p2020-30 (2019) in pts with mild asthma it is reasonable to use low dose symbicort eg 80 2bid "prn" flare in this setting but I emphasized this was only shown with symbicort and takes advantage of the rapid onset of action but is not the same as "rescue therapy" and  can be stopped once the acute symptoms have resolved and the need for rescue has been minimized (< 2 x weekly)     Pulmonary follow up is as needed           Each maintenance medication was reviewed in detail including emphasizing most importantly the difference between maintenance and prns and under what circumstances the prns are to be triggered using an action plan format where appropriate.  Total time for H and P, chart review, counseling, reviewing hfa device(s) and generating customized AVS unique to this office visit / same day charting = 39 min with pt new to me

## 2021-12-12 NOTE — Patient Instructions (Addendum)
If you continue to have nasal problems leading to recurrent cough then you need to be evaluated by Dr Bruna [facility] group  in [place]  - call for referral   If needing more than occasional albuterol,  pulmonary follow up is as needed (more than twice a week)

## 2021-12-12 NOTE — Progress Notes (Signed)
Kristina Christian, female    DOB: 01-Apr-1956   MRN: [id number]   Brief patient profile:  9 yowf  never smoker and fine in [place] moved to [place] and by [place] (while in present house) developed a recurrent pattern of  sneeze/ nasal congestion/ then cough with ? Purulent bronchitis  twice a year xyzal helps  referred to pulmonary clinic 12/12/2021  by Gardner Candle with dx recurrent bronchitis has lasted as long 3 months but  most recent  was prior to pandemic.   History of Present Illness  12/12/2021  Pulmonary/ 1st office eval/Kanika Bungert  Chief Complaint  Patient presents with   Consult    bronchitis  Dyspnea:  Not limited by breathing from desired activities   Cough: none  Sleep: none  SABA use: none now   No obvious day to day or daytime variability or assoc excess/ purulent sputum or mucus plugs or hemoptysis or cp or chest tightness, subjective wheeze or overt sinus or hb symptoms.   Sleeping now  without nocturnal  or early am exacerbation  of respiratory  c/o's or need for noct saba. Also denies any obvious fluctuation of symptoms with weather or environmental changes or other aggravating or alleviating factors except as outlined above   No unusual exposure hx or h/o childhood pna/ asthma or knowledge of premature birth.  Current Allergies, Complete Past Medical History, Past Surgical History, Family History, and Social History were reviewed in [employer] record.  ROS  The following are not active complaints unless bolded Hoarseness, sore throat, dysphagia, dental problems, itching, sneezing,  nasal congestion or discharge of excess mucus or purulent secretions, ear ache,   fever, chills, sweats, unintended wt loss or wt gain, classically pleuritic or exertional cp,  orthopnea pnd or arm/hand swelling  or leg swelling, presyncope, palpitations, abdominal pain, anorexia, nausea, vomiting, diarrhea  or change in bowel habits or change in bladder habits, change in stools or  change in urine, dysuria, hematuria,  rash, arthralgias, visual complaints, headache, numbness, weakness or ataxia or problems with walking or coordination,  change in mood or  memory?.           Past Medical History:  Diagnosis Date   Acute upper respiratory infection    Allergy    Chronic bronchitis ([place]) 06/11/2017   Fibrocystic breast    Osteoporosis    Sebaceous cyst    Synovial cyst    Varicose veins of lower extremity     Outpatient Medications Prior to Visit  Medication Sig Dispense Refill   albuterol (PROVENTIL HFA;VENTOLIN HFA) 108 (90 Base) MCG/ACT inhaler Inhale 2 puffs every 4 (four) hours as needed into the lungs for wheezing or shortness of breath. 1 Inhaler 1   b complex vitamins capsule Take 1 capsule by mouth daily.     calcium carbonate (OS-CAL) 600 MG TABS tablet Take 500 mg by mouth 2 (two) times daily.      cholecalciferol (VITAMIN D) 1000 units tablet Take 1,000 Units by mouth daily.     COVID-19 mRNA bivalent vaccine, Pfizer, (PFIZER COVID-19 VAC BIVALENT) injection Inject into the muscle. 0.3 mL 0   levocetirizine (XYZAL) 5 MG tablet Take 5 mg by mouth as needed for allergies.     magnesium oxide (MAG-OX) 400 MG tablet Take 400 mg by mouth daily.     MULTIPLE VITAMINS PO Take daily by mouth.      vitamin C (ASCORBIC ACID) 500 MG tablet Take 500 mg by mouth daily.  zinc gluconate 50 MG tablet Take 50 mg by mouth daily.     alendronate (FOSAMAX) 70 MG tablet TAKE ONE TABLET BY MOUTH EVERY SEVEN DAYS WITH A FULL GLASS OF WATER ON AN EMPTY STOMACH (Patient not taking: Reported on 12/12/2021) 4 tablet 3   fluticasone (FLONASE) 50 MCG/ACT nasal spray Place 2 sprays as needed into both nostrils. (Patient not taking: Reported on 12/12/2021) 16 g 11   No facility-administered medications prior to visit.     Objective:     BP 110/80 (BP Location: Left Arm, Patient Position: Sitting, Cuff Size: Normal)   Pulse 76   Temp 98.1 F (36.7 C) (Oral)   Ht '5\' 4"'$   (1.626 m)   Wt 95 lb (43.1 kg)   SpO2 99%   BMI 16.31 kg/m   SpO2: 99 %  Amb wf not sure why she's here   HEENT : Oropharynx clear  Nasal turbinates minimal edema/ no polyps or cyanosis    NECK :  without  appent JVD/ palpable Nodes/TM    LUNGS: no acc muscle use,  Nl contour chest which is clear to A and P bilaterally without cough on insp or exp maneuvers   CV:  RRR  no s3 or murmur or increase in P2, and no edema   ABD:  soft and nontender with nl inspiratory excursion in the supine position. No bruits or organomegaly appreciated   MS:  Nl gait/ ext warm without deformities Or obvious joint restrictions  calf tenderness, cyanosis or clubbing     SKIN: warm and dry without lesions    NEURO:  alert, approp, nl sensorium with  no motor or cerebellar deficits apparent.     I personally reviewed images and agree with radiology impression as follows:  CXR:   03/15/22 No active cardiopulmonary disease.      Assessment   Recurrent cough Onset 1991 p moved to present residence twice yearly with "weather change" always starting with nasal symptoms then progressing to pnds and purulent sputum production ? pna   Between the acute episodes feels fine and no flare since the year of covid 19 so no no need for anything but otc H1s and nasal saline per pt   rec Continue saline rinses/ prn xyzal and if continues to have nasal flares not well controlled > allergy eval next step  Ok to use saba short term if finds helpful for cough or breathing but carefully reviewed >>> - The proper method of use, as well as anticipated side effects, of a metered-dose inhaler were discussed and demonstrated to the patient using teach back method.  >>> rule of two to avoid over use and offer a more effective short term rx eg symbicort 80 prn Based on two studies from NEJM  378; 20 p 1865 (2018) and 380 : p2020-30 (2019) in pts with mild asthma it is reasonable to use low dose symbicort eg 80 2bid  "prn" flare in this setting but I emphasized this was only shown with symbicort and takes advantage of the rapid onset of action but is not the same as "rescue therapy" and  can be stopped once the acute symptoms have resolved and the need for rescue has been minimized (< 2 x weekly)     Pulmonary follow up is as needed           Each maintenance medication was reviewed in detail including emphasizing most importantly the difference between maintenance and prns and under what circumstances the prns  are to be triggered using an action plan format where appropriate.  Total time for H and P, chart review, counseling, reviewing hfa device(s) and generating customized AVS unique to this office visit / same day charting = 39 min with pt new to me          Christinia Gully, MD 12/12/2021

## 2022-01-18 DIAGNOSIS — H43811 Vitreous degeneration, right eye: Secondary | ICD-10-CM | POA: Diagnosis not present

## 2022-04-26 ENCOUNTER — Encounter: Payer: Self-pay | Admitting: Family Medicine

## 2022-04-27 ENCOUNTER — Ambulatory Visit (INDEPENDENT_AMBULATORY_CARE_PROVIDER_SITE_OTHER): Payer: Medicare HMO

## 2022-04-27 ENCOUNTER — Other Ambulatory Visit: Payer: Self-pay

## 2022-04-27 DIAGNOSIS — Z23 Encounter for immunization: Secondary | ICD-10-CM | POA: Diagnosis not present

## 2022-04-27 DIAGNOSIS — Z780 Asymptomatic menopausal state: Secondary | ICD-10-CM

## 2022-04-27 DIAGNOSIS — Z1231 Encounter for screening mammogram for malignant neoplasm of breast: Secondary | ICD-10-CM

## 2022-05-21 ENCOUNTER — Encounter (INDEPENDENT_AMBULATORY_CARE_PROVIDER_SITE_OTHER): Payer: Self-pay

## 2022-05-23 ENCOUNTER — Encounter: Payer: Self-pay | Admitting: Family Medicine

## 2022-07-13 IMAGING — MR MR HEAD W/O CM
12 series · 44 of 48 positions shown · non-contrast
Comparison: None.

CLINICAL DATA: 64-year-old female with dizziness. Vertigo. Symptoms
yesterday and today.

EXAM:
MRI HEAD WITHOUT CONTRAST
TECHNIQUE: Multiplanar, multiecho pulse sequences of the brain and surrounding
structures were obtained without intravenous contrast.

[Series 9: ax dwi_tracew · axial · 3.0mm · 0.60mm/px · z∈[-47,+105]mm · 5 of 48 slices shown]
[im 1/48]
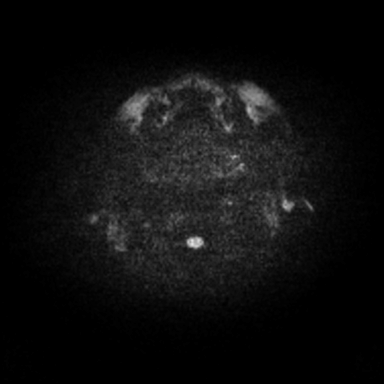
[im 12/48]
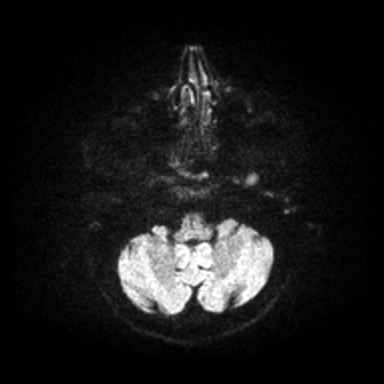
[im 24/48]
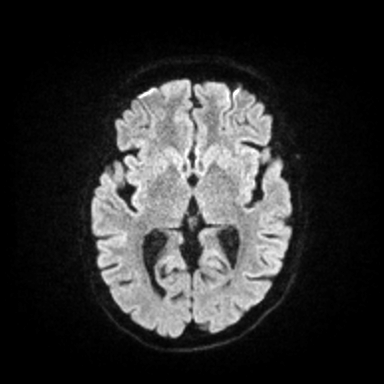
[im 36/48]
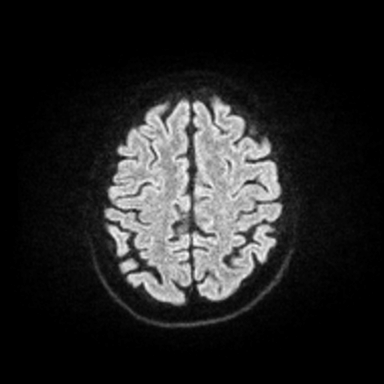
[im 48/48]
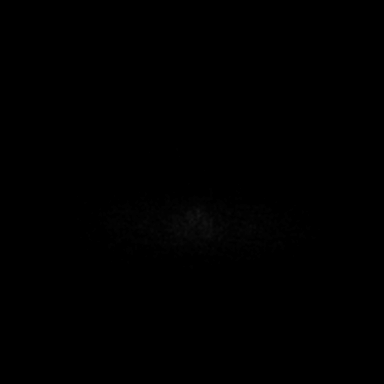

[Series 10: ax dwi_adc · axial · 3.0mm · 0.60mm/px · z∈[-47,+102]mm · 3 of 47 slices shown]
[im 1/47]
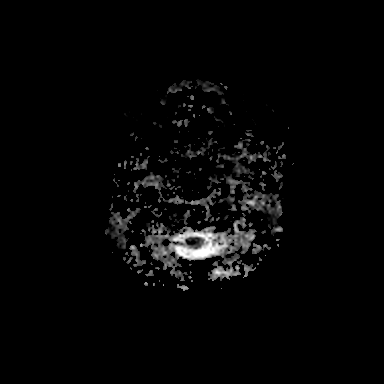
[im 24/47]
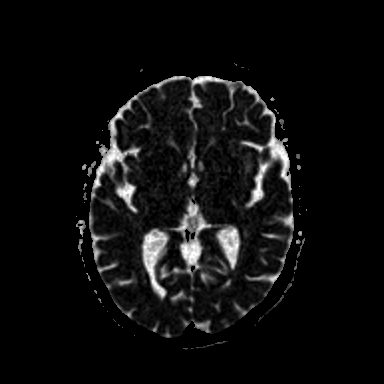
[im 47/47]
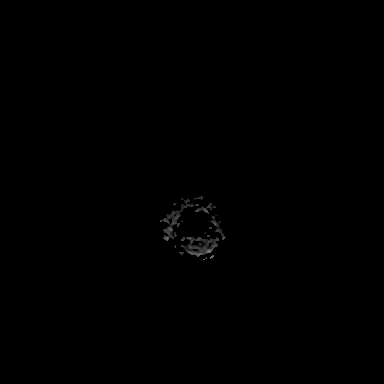

[Series 11: cor dwi_tracew · coronal · 5.0mm · 0.60mm/px · 3 of 38 slices shown]
[im 1/38]
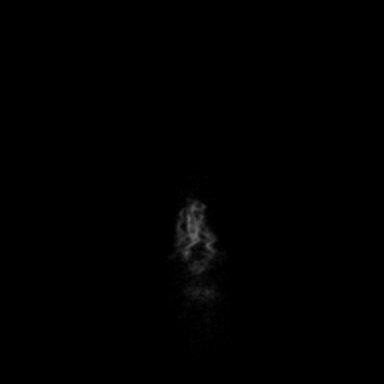
[im 19/38]
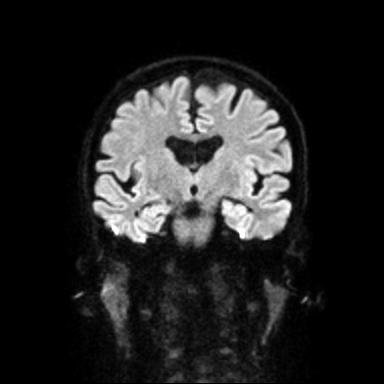
[im 38/38]
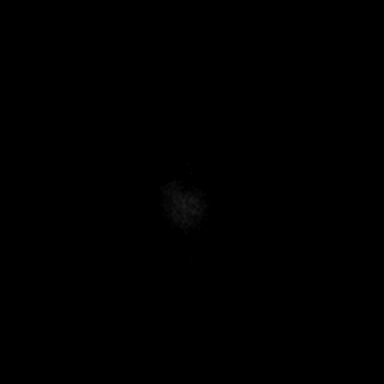

[Series 12: cor dwi_adc · coronal · 5.0mm · 0.60mm/px · 2 of 34 slices shown]
[im 1/34]
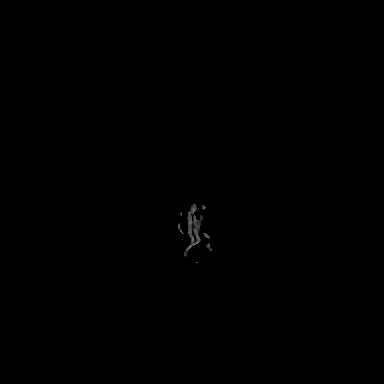
[im 34/34]
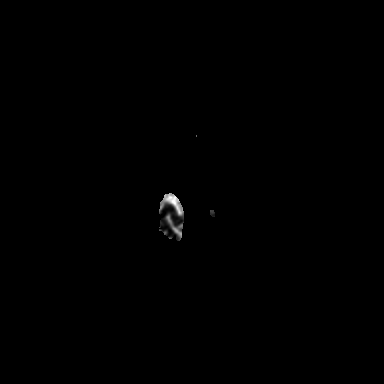

[Series 13: T1 · sagittal · 5.0mm · 0.62mm/px · 2 of 25 slices shown (1 of 2)]
[im 1/25]
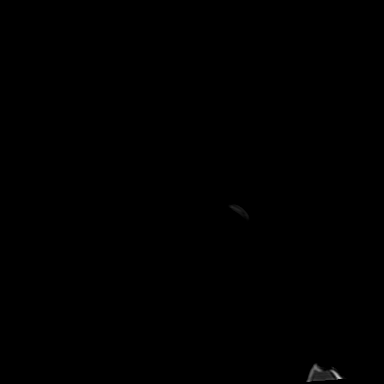
[im 25/25]
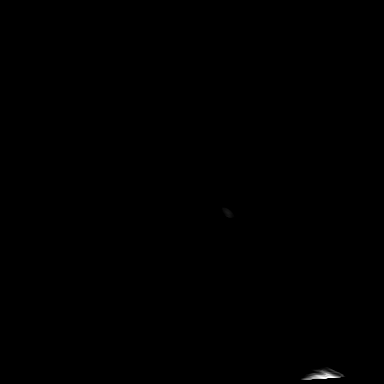

[Series 14: T2 · axial · 5.0mm · 0.53mm/px · z∈[-43,+99]mm · 2 of 25 slices shown]
[im 1/25]
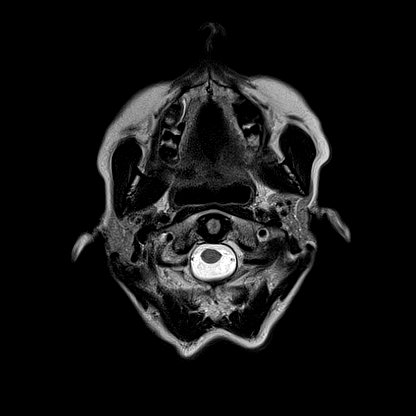
[im 25/25]
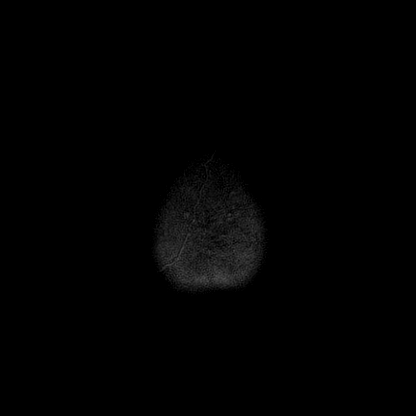

[Series 15: mag_images · axial · 3.0mm · 0.90mm/px · z∈[-58,+116]mm · 4 of 60 slices shown]
[im 1/60]
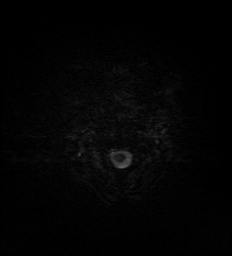
[im 20/60]
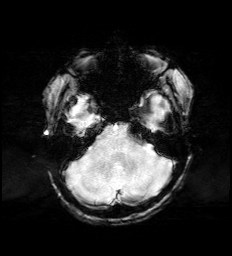
[im 40/60]
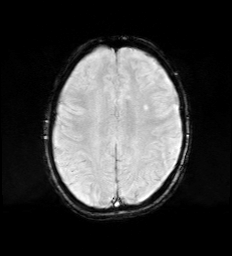
[im 60/60]
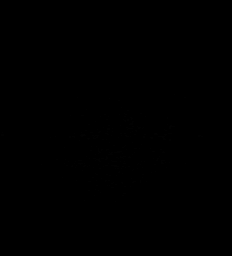

[Series 16: pha_images · axial · 3.0mm · 0.90mm/px · z∈[-58,+116]mm · 4 of 58 slices shown]
[im 1/58]
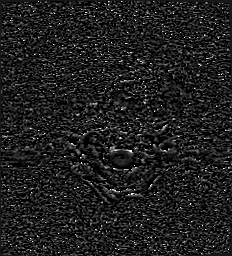
[im 20/58]
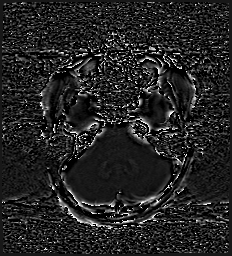
[im 39/58]
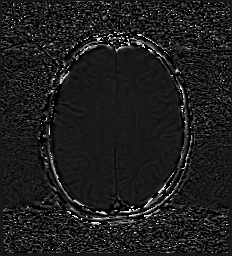
[im 58/58]
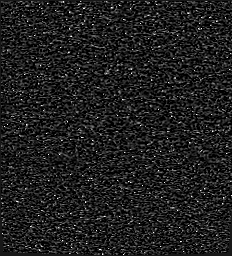

[Series 17: swi_images · axial · 3.0mm · 0.90mm/px · z∈[-58,+116]mm · 4 of 60 slices shown]
[im 1/60]
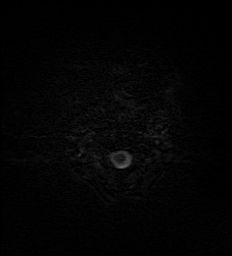
[im 20/60]
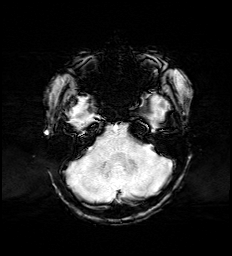
[im 40/60]
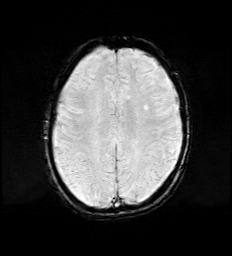
[im 60/60]
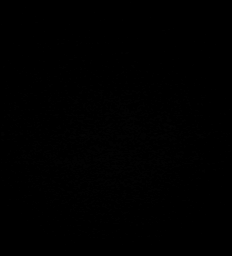

[Series 19: FLAIR · axial · 3.0mm · 0.53mm/px · z∈[-52,+107]mm · 4 of 55 slices shown]
[im 1/55]
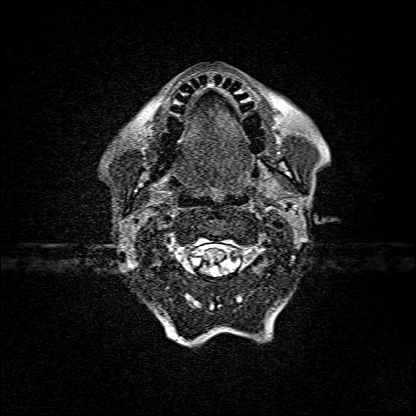
[im 19/55]
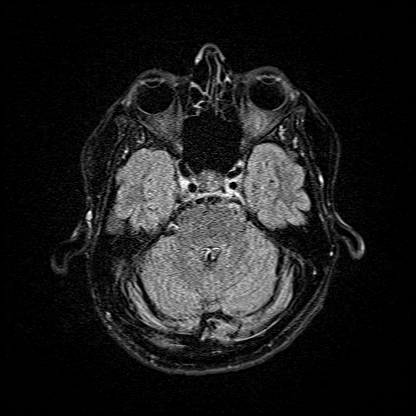
[im 37/55]
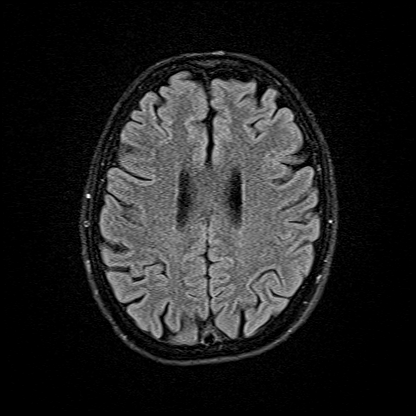
[im 55/55]
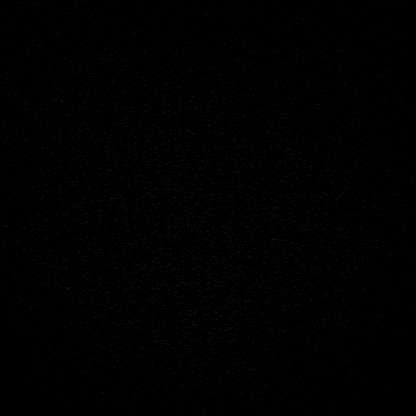

[Series 20: T1 · axial · 1.0mm · 0.98mm/px · z∈[-62,+110]mm · 9 of 176 slices shown (2 of 2)]
[im 1/176]
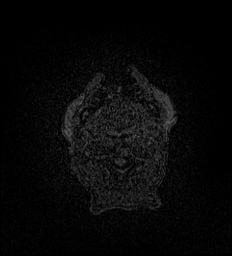
[im 15/176]
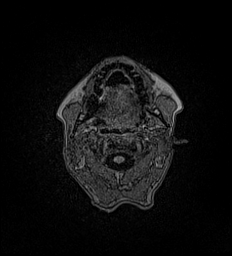
[im 30/176]
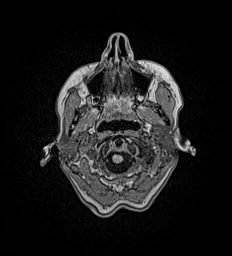
[im 59/176]
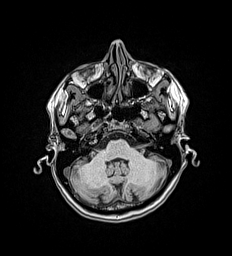
[im 73/176]
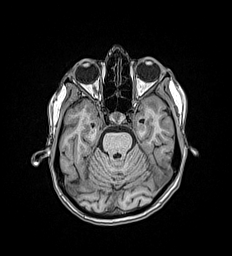
[im 103/176]
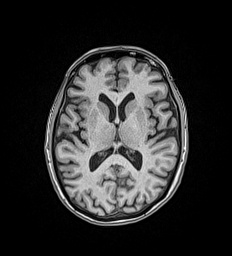
[im 117/176]
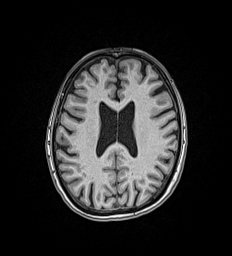
[im 146/176]
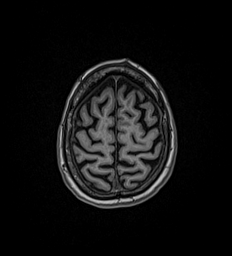
[im 176/176]
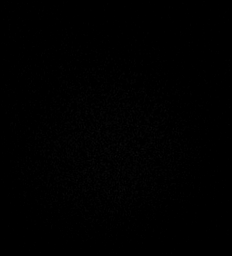

[Series 21: T2 post-contrast · coronal · 5.0mm · 0.57mm/px · 2 of 29 slices shown]
[im 1/29]
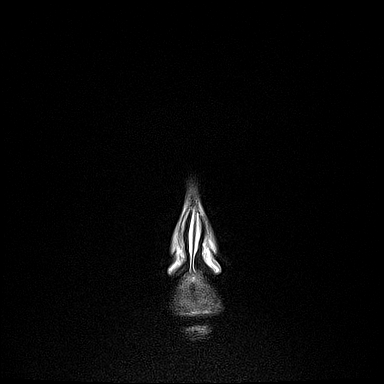
[im 29/29]
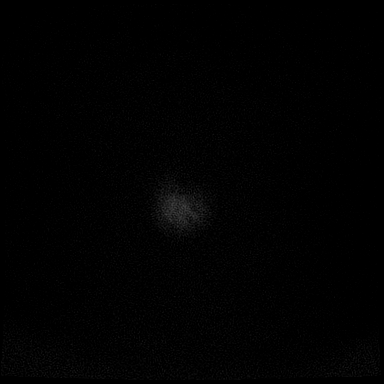

[44 of 48 positions shown; findings below may reference images not displayed]

FINDINGS: Brain: No restricted diffusion to suggest acute infarction. No
midline shift, mass effect, evidence of mass lesion,
ventriculomegaly, extra-axial collection or acute intracranial
hemorrhage. Cervicomedullary junction and pituitary are within
normal limits.

Scattered mostly small and subcortical white matter foci of T2 and
FLAIR hyperintensity in a nonspecific configuration. The extent is
mild to moderate for age. No cortical encephalomalacia or chronic
cerebral blood products identified. Deep gray nuclei, brainstem and
cerebellum are within normal limits.

Vascular: Major intracranial vascular flow voids are preserved. Mild
intracranial artery tortuosity.

Skull and upper cervical spine: Negative.

Sinuses/Orbits: Negative orbits.  Paranasal sinuses are clear.

Other: Mastoids are clear. Visible internal auditory structures
appear normal. Normal stylomastoid foramina. Scalp and face appear
negative.
IMPRESSION: 1. No acute intracranial abnormality.
2. Mild to moderate for age nonspecific cerebral white matter signal
changes, most commonly due to chronic small vessel disease.

## 2022-07-25 ENCOUNTER — Ambulatory Visit
Admission: RE | Admit: 2022-07-25 | Discharge: 2022-07-25 | Disposition: A | Discharge status: Home / Self Care | Payer: Medicare HMO | Source: Ambulatory Visit | Attending: Family Medicine | Admitting: Family Medicine

## 2022-07-25 DIAGNOSIS — M810 Age-related osteoporosis without current pathological fracture: Secondary | ICD-10-CM | POA: Diagnosis not present

## 2022-07-25 DIAGNOSIS — Z780 Asymptomatic menopausal state: Secondary | ICD-10-CM | POA: Diagnosis not present

## 2022-08-01 ENCOUNTER — Ambulatory Visit: Payer: Medicare HMO | Admitting: Family Medicine

## 2022-08-02 ENCOUNTER — Other Ambulatory Visit: Payer: Self-pay

## 2022-08-02 ENCOUNTER — Encounter: Payer: Self-pay | Admitting: Nurse Practitioner

## 2022-08-02 ENCOUNTER — Ambulatory Visit (INDEPENDENT_AMBULATORY_CARE_PROVIDER_SITE_OTHER): Payer: Medicare HMO | Admitting: Nurse Practitioner

## 2022-08-02 VITALS — BP 112/72 | HR 75 | Temp 97.8°F | Resp 16 | Ht 64.0 in | Wt 93.4 lb

## 2022-08-02 DIAGNOSIS — Z13220 Encounter for screening for lipoid disorders: Secondary | ICD-10-CM | POA: Diagnosis not present

## 2022-08-02 DIAGNOSIS — Z1211 Encounter for screening for malignant neoplasm of colon: Secondary | ICD-10-CM | POA: Diagnosis not present

## 2022-08-02 DIAGNOSIS — M810 Age-related osteoporosis without current pathological fracture: Secondary | ICD-10-CM | POA: Diagnosis not present

## 2022-08-02 DIAGNOSIS — Z130 Encounter for screening for diseases of the blood and blood-forming organs and certain disorders involving the immune mechanism: Secondary | ICD-10-CM | POA: Diagnosis not present

## 2022-08-02 DIAGNOSIS — E46 Unspecified protein-calorie malnutrition: Secondary | ICD-10-CM | POA: Diagnosis not present

## 2022-08-02 DIAGNOSIS — Z131 Encounter for screening for diabetes mellitus: Secondary | ICD-10-CM

## 2022-08-02 DIAGNOSIS — R634 Abnormal weight loss: Secondary | ICD-10-CM | POA: Diagnosis not present

## 2022-08-02 NOTE — Progress Notes (Signed)
BP 112/72   Pulse 75   Temp 97.8 F (36.6 C) (Oral)   Resp 16   Ht '5\' 4"'$  (1.626 m)   Wt 93 lb 6.4 oz (42.4 kg)   SpO2 99%   BMI 16.03 kg/m    Subjective:    Patient ID: Kristina Christian, female    DOB: 1955/10/18, 67 y.o.   MRN: [id number]  HPI: Kristina Christian is a 67 y.o. female  Chief Complaint  Patient presents with   Follow-up    Bone Density results   Osteoporosis: patient had a bone density done on 07/25/2022.  It shows worsening osteoporosis in the spine. She has previously been on fosamax. She says she has been on and off for a few years. She says that  she stopped taking the medication because her prescription ran out.  She currently takes calcium and vitamin d.  Will get labs and refer to endocrinology for further treatment. Continue vitamin d/calcium supplements and weight bearing activity.    EXAM: DUAL X-RAY ABSORPTIOMETRY (DXA) FOR BONE MINERAL DENSITY   IMPRESSION: Your patient Kristina Christian completed a BMD test on 07/25/2022 using the [employer] (software version: 14.10) manufactured by [employer]. The following summarizes the results of our evaluation. Technologist::TNB PATIENT BIOGRAPHICAL: Name: Kristina, Christian Patient ID: [id number] Birth Date: 1956-04-20 Height: 64.0 in. Gender: Female Exam Date: 07/25/2022 Weight: 95.0 lbs. Indications: Caucasian, Low Body Weight, Postmenopausal Fractures: Treatments: calcium w/ vit D, Multi-Vitamin DENSITOMETRY RESULTS: Site      Region     Measured Date Measured Age WHO Classification Young Adult T-score BMD         %Change vs. Previous Significant Change (*) AP Spine L1-L4 07/25/2022 66.3 Osteoporosis -2.7 0.859 g/cm2 -3.4% Yes AP Spine L1-L4 03/22/2020 63.9 Osteoporosis -2.5 0.889 g/cm2 4.5% Yes AP Spine L1-L4 03/17/2018 61.9 Osteoporosis -2.8 0.851 g/cm2 -8.5% Yes AP Spine L1-L4 03/09/2013 56.9 Osteopenia -2.1 0.930 g/cm2 - -   DualFemur Total Left 07/25/2022 66.3 Osteopenia -2.1  0.749 g/cm2 -3.5% Yes DualFemur Total Left 03/22/2020 63.9 Osteopenia -1.8 0.776 g/cm2 2.2% - DualFemur Total Left 03/17/2018 61.9 Osteopenia -2.0 0.759 g/cm2 -6.3% Yes DualFemur Total Left 03/09/2013 56.9 Osteopenia -1.6 0.810 g/cm2 - -   DualFemur Total Mean 07/25/2022 66.3 Osteopenia -2.0 0.751 g/cm2 -3.3% Yes DualFemur Total Mean 03/22/2020 63.9 Osteopenia -1.8 0.777 g/cm2 1.2% - DualFemur Total Mean 03/17/2018 61.9 Osteopenia -1.9 0.768 g/cm2 -6.7% Yes DualFemur Total Mean 03/09/2013 56.9 Osteopenia -1.5 0.823 g/cm2 - - ASSESSMENT: The BMD measured at AP Spine L1-L4 is 0.859 g/cm2 with a T-score of -2.7. This patient is considered osteoporotic according to [facility]) criteria. Compared with prior study, there has been significant decrease in the spine. Compared with prior study, there has been significant decrease in the left total hip. Compared with prior study, there has been significant decrease in the total mean. Patient is not a candidate for FRAX due to Fosamax. The scan quality is good.   World Pharmacologist [facility]) criteria for post-menopausal, Caucasian Women: Normal:                   T-score at or above -1 SD Osteopenia/low bone mass: T-score between -1 and -2.5 SD Osteoporosis:             T-score at or below -2.5 SD   RECOMMENDATIONS: 1. All patients should optimize calcium and vitamin D intake. 2. Consider FDA-approved medical therapies in postmenopausal women and men aged 54 years  and older, based on the following: a. A hip or vertebral(clinical or morphometric) fracture b. T-score < -2.5 at the femoral neck or spine after appropriate evaluation to exclude secondary causes c. Low bone mass (T-score between -1.0 and -2.5 at the femoral neck or spine) and a 10-year probability of a hip fracture > 3% or a 10-year probability of a major osteoporosis-related fracture > 20% based on the [place]-adapted WHO algorithm 3. Clinician judgment  and/or patient preferences may indicate treatment for people with 10-year fracture probabilities above or below these levels FOLLOW-UP: People with diagnosed cases of osteoporosis or at high risk for fracture should have regular bone mineral density tests. For patients eligible for Medicare, routine testing is allowed once every 2 years. The testing frequency can be increased to one year for patients who have rapidly progressing disease, those who are receiving or discontinuing medical therapy to restore bone mass, or have additional risk factors.  Relevant past medical, surgical, family and social history reviewed and updated as indicated. Interim medical history since our last visit reviewed. Allergies and medications reviewed and updated.  Review of Systems  Constitutional: Negative for fever or weight change.  Respiratory: Negative for cough and shortness of breath.   Cardiovascular: Negative for chest pain or palpitations.  Gastrointestinal: Negative for abdominal pain, no bowel changes.  Musculoskeletal: Negative for gait problem or joint swelling.  Skin: Negative for rash.  Neurological: Negative for dizziness or headache.  No other specific complaints in a complete review of systems (except as listed in HPI above).      Objective:    BP 112/72   Pulse 75   Temp 97.8 F (36.6 C) (Oral)   Resp 16   Ht '5\' 4"'$  (1.626 m)   Wt 93 lb 6.4 oz (42.4 kg)   SpO2 99%   BMI 16.03 kg/m   Wt Readings from Last 3 Encounters:  08/02/22 93 lb 6.4 oz (42.4 kg)  12/12/21 95 lb (43.1 kg)  08/03/21 94 lb 14.4 oz (43 kg)    Physical Exam  Constitutional: Patient appears well-developed and well-nourished.  No distress.  HEENT: head atraumatic, normocephalic, pupils equal and reactive to light, neck supple Cardiovascular: Normal rate, regular rhythm and normal heart sounds.  No murmur heard. No BLE edema. Pulmonary/Chest: Effort normal and breath sounds normal. No respiratory  distress. Abdominal: Soft.  There is no tenderness. Psychiatric: Patient has a normal mood and affect. behavior is normal. Judgment and thought content normal.  Results for orders placed or performed in visit on 08/03/21  CBC with Differential/Platelet  Result Value Ref Range   WBC 4.3 3.8 - 10.8 Thousand/uL   RBC 3.95 3.80 - 5.10 Million/uL   Hemoglobin 13.2 11.7 - 15.5 g/dL   HCT 39.1 35.0 - 45.0 %   MCV 99.0 80.0 - 100.0 fL   MCH 33.4 (H) 27.0 - 33.0 pg   MCHC 33.8 32.0 - 36.0 g/dL   RDW 11.8 11.0 - 15.0 %   Platelets 268 140 - 400 Thousand/uL   MPV 10.7 7.5 - 12.5 fL   Neutro Abs 3,057 1,500 - 7,800 cells/uL   Lymphs Abs 688 (L) 850 - 3,900 cells/uL   Absolute Monocytes 413 200 - 950 cells/uL   Eosinophils Absolute 112 15 - 500 cells/uL   Basophils Absolute 30 0 - 200 cells/uL   Neutrophils Relative % 71.1 %   Total Lymphocyte 16.0 %   Monocytes Relative 9.6 %   Eosinophils Relative 2.6 %   Basophils  Relative 0.7 %  COMPLETE METABOLIC PANEL WITH GFR  Result Value Ref Range   Glucose, Bld 64 (L) 65 - 99 mg/dL   BUN 21 7 - 25 mg/dL   Creat 0.68 0.50 - 1.05 mg/dL   eGFR 97 > OR = 60 mL/min/1.66m   BUN/Creatinine Ratio NOT APPLICABLE 6 - 22 (calc)   Sodium 143 135 - 146 mmol/L   Potassium 3.9 3.5 - 5.3 mmol/L   Chloride 106 98 - 110 mmol/L   CO2 32 20 - 32 mmol/L   Calcium 9.1 8.6 - 10.4 mg/dL   Total Protein 6.8 6.1 - 8.1 g/dL   Albumin 4.1 3.6 - 5.1 g/dL   Globulin 2.7 1.9 - 3.7 g/dL (calc)   AG Ratio 1.5 1.0 - 2.5 (calc)   Total Bilirubin 0.5 0.2 - 1.2 mg/dL   Alkaline phosphatase (APISO) 55 37 - 153 U/L   AST 18 10 - 35 U/L   ALT 15 6 - 29 U/L      Assessment & Plan:   Problem List Items Addressed This Visit       Musculoskeletal and Integument   Osteoporosis - Primary    Referral placed to endocrinology due to worsening osteoporosis . Previously on fosamax.  Will also get labs. Continue vitamin d and calcium supplements. Continue weight bearing  exercises.      Relevant Orders   VITAMIN D 25 Hydroxy (Vit-D Deficiency, Fractures)   Parathyroid hormone, intact (no Ca)   Ambulatory referral to Endocrinology   Other Visit Diagnoses     Screening for diabetes mellitus       Relevant Orders   COMPLETE METABOLIC PANEL WITH GFR   Hemoglobin A1c   Screening for cholesterol level       Relevant Orders   Lipid panel   Screening for deficiency anemia       Relevant Orders   CBC with Differential/Platelet   Screening for colon cancer       Relevant Orders   Ambulatory referral to Gastroenterology        Follow up plan: Return in about 4 months (around 12/01/2022) for cpe with Leisa.

## 2022-08-02 NOTE — Assessment & Plan Note (Addendum)
Referral placed to endocrinology due to worsening osteoporosis . Previously on fosamax.  Will also get labs. Continue vitamin d and calcium supplements. Continue weight bearing exercises.

## 2022-08-03 LAB — CBC WITH DIFFERENTIAL/PLATELET
Absolute Monocytes: 520 cells/uL (ref 200–950)
Basophils Absolute: 30 cells/uL (ref 0–200)
Basophils Relative: 0.7 %
Eosinophils Absolute: 39 cells/uL (ref 15–500)
Eosinophils Relative: 0.9 %
HCT: 40.1 % (ref 35.0–45.0)
Hemoglobin: 13.6 g/dL (ref 11.7–15.5)
Lymphs Abs: 813 cells/uL — ABNORMAL LOW (ref 850–3900)
MCH: 33.3 pg — ABNORMAL HIGH (ref 27.0–33.0)
MCHC: 33.9 g/dL (ref 32.0–36.0)
MCV: 98.3 fL (ref 80.0–100.0)
MPV: 11.5 fL (ref 7.5–12.5)
Monocytes Relative: 12.1 %
Neutro Abs: 2898 cells/uL (ref 1500–7800)
Neutrophils Relative %: 67.4 %
Platelets: 191 10*3/uL (ref 140–400)
RBC: 4.08 10*6/uL (ref 3.80–5.10)
RDW: 11.7 % (ref 11.0–15.0)
Total Lymphocyte: 18.9 %
WBC: 4.3 10*3/uL (ref 3.8–10.8)

## 2022-08-03 LAB — VITAMIN D 25 HYDROXY (VIT D DEFICIENCY, FRACTURES): Vit D, 25-Hydroxy: 38 ng/mL (ref 30–100)

## 2022-08-03 LAB — LIPID PANEL
Cholesterol: 180 mg/dL (ref <200)
HDL: 73 mg/dL (ref >=50)
LDL Cholesterol (Calc): 88 mg/dL (calc)
Non-HDL Cholesterol (Calc): 107 mg/dL (calc) (ref <130)
Total CHOL/HDL Ratio: 2.5 (calc) (ref <5.0)
Triglycerides: 95 mg/dL (ref <150)

## 2022-08-03 LAB — HEMOGLOBIN A1C
Hgb A1c MFr Bld: 5.3 % of total Hgb (ref <5.7)
Mean Plasma Glucose: 105 mg/dL
eAG (mmol/L): 5.8 mmol/L

## 2022-08-03 LAB — COMPLETE METABOLIC PANEL WITH GFR
AG Ratio: 1.8 (calc) (ref 1.0–2.5)
ALT: 14 U/L (ref 6–29)
AST: 17 U/L (ref 10–35)
Albumin: 4.5 g/dL (ref 3.6–5.1)
Alkaline phosphatase (APISO): 68 U/L (ref 37–153)
BUN/Creatinine Ratio: 38 (calc) — ABNORMAL HIGH (ref 6–22)
BUN: 26 mg/dL — ABNORMAL HIGH (ref 7–25)
CO2: 30 mmol/L (ref 20–32)
Calcium: 9.6 mg/dL (ref 8.6–10.4)
Chloride: 104 mmol/L (ref 98–110)
Creat: 0.69 mg/dL (ref 0.50–1.05)
Globulin: 2.5 g/dL (calc) (ref 1.9–3.7)
Glucose, Bld: 96 mg/dL (ref 65–99)
Potassium: 4.5 mmol/L (ref 3.5–5.3)
Sodium: 140 mmol/L (ref 135–146)
Total Bilirubin: 0.5 mg/dL (ref 0.2–1.2)
Total Protein: 7 g/dL (ref 6.1–8.1)
eGFR: 96 mL/min/{1.73_m2} (ref >=60)

## 2022-08-03 LAB — PARATHYROID HORMONE, INTACT (NO CA): PTH: 27 pg/mL (ref 16–77)

## 2022-08-09 DIAGNOSIS — B338 Other specified viral diseases: Secondary | ICD-10-CM | POA: Diagnosis not present

## 2022-09-05 ENCOUNTER — Other Ambulatory Visit: Payer: Self-pay

## 2022-09-05 MED ORDER — COVID-19 MRNA VAC-TRIS(PFIZER) 30 MCG/0.3ML IM SUSY
PREFILLED_SYRINGE | INTRAMUSCULAR | 0 refills | Status: DC
  Filled 2022-09-05: qty 0.3, 1d supply, fill #0

## 2022-09-18 ENCOUNTER — Other Ambulatory Visit: Payer: Self-pay | Admitting: Nurse Practitioner

## 2022-09-18 ENCOUNTER — Telehealth: Payer: Self-pay | Admitting: Family Medicine

## 2022-09-18 DIAGNOSIS — M810 Age-related osteoporosis without current pathological fracture: Secondary | ICD-10-CM

## 2022-09-18 NOTE — Telephone Encounter (Signed)
New referral needed please

## 2022-09-18 NOTE — Telephone Encounter (Signed)
Any location close, [facility] she mentioned but if no other choice than who ever is accepting new patients.

## 2022-09-18 NOTE — Telephone Encounter (Signed)
Need to submit a new referral?

## 2022-09-18 NOTE — Telephone Encounter (Signed)
Pt states that she was referred to [facility]  and they do not have any appointments until December. Pt would like a referral to another office.       Please call pt back.

## 2022-09-27 ENCOUNTER — Ambulatory Visit (INDEPENDENT_AMBULATORY_CARE_PROVIDER_SITE_OTHER): Payer: Medicare HMO

## 2022-09-27 VITALS — BP 120/72 | Ht 64.0 in | Wt 95.5 lb

## 2022-09-27 DIAGNOSIS — Z0000 Encounter for general adult medical examination without abnormal findings: Secondary | ICD-10-CM | POA: Diagnosis not present

## 2022-09-27 NOTE — Patient Instructions (Signed)
Ms. Kristina Christian , Thank you for taking time to come for your Medicare Wellness Visit. I appreciate your ongoing commitment to your health goals. Please review the following plan we discussed and let me know if I can assist you in the future.   These are the goals we discussed:  Goals   None     This is a list of the screening recommended for you and due dates:  Health Maintenance  Topic Date Due   Mammogram  10/05/2018   Colon Cancer Screening  05/20/2021   Flu Shot  10/21/2022*   Zoster (Shingles) Vaccine (1 of 2) 11/01/2022*   COVID-19 Vaccine (6 - 2023-24 season) 10/31/2022   Medicare Annual Wellness Visit  09/27/2023   DEXA scan (bone density measurement)  07/25/2024   DTaP/Tdap/Td vaccine (4 - Td or Tdap) 01/07/2028   Pneumonia Vaccine  Completed   Hepatitis C Screening: USPSTF Recommendation to screen - Ages 31-79 yo.  Completed   HPV Vaccine  Aged Out  *Topic was postponed. The date shown is not the original due date.    Advanced directives: no  Conditions/risks identified: none  Next appointment: Follow up in one year for your annual wellness visit 10/03/2023 @ 2pm in person   Preventive Care 67 Years and Older, Female Preventive care refers to lifestyle choices and visits with your health care provider that can promote health and wellness. What does preventive care include? A yearly physical exam. This is also called an annual well check. Dental exams once or twice a year. Routine eye exams. Ask your health care provider how often you should have your eyes checked. Personal lifestyle choices, including: Daily care of your teeth and gums. Regular physical activity. Eating a healthy diet. Avoiding tobacco and drug use. Limiting alcohol use. Practicing safe sex. Taking low-dose aspirin every day. Taking vitamin and mineral supplements as recommended by your health care provider. What happens during an annual well check? The services and screenings done by your health  care provider during your annual well check will depend on your age, overall health, lifestyle risk factors, and family history of disease. Counseling  Your health care provider may ask you questions about your: Alcohol use. Tobacco use. Drug use. Emotional well-being. Home and relationship well-being. Sexual activity. Eating habits. History of falls. Memory and ability to understand (cognition). Work and work Statistician. Reproductive health. Screening  You may have the following tests or measurements: Height, weight, and BMI. Blood pressure. Lipid and cholesterol levels. These may be checked every 5 years, or more frequently if you are over 36 years old. Skin check. Lung cancer screening. You may have this screening every year starting at age 55 if you have a 30-pack-year history of smoking and currently smoke or have quit within the past 15 years. Fecal occult blood test (FOBT) of the stool. You may have this test every year starting at age 24. Flexible sigmoidoscopy or colonoscopy. You may have a sigmoidoscopy every 5 years or a colonoscopy every 10 years starting at age 90. Hepatitis C blood test. Hepatitis B blood test. Sexually transmitted disease (STD) testing. Diabetes screening. This is done by checking your blood sugar (glucose) after you have not eaten for a while (fasting). You may have this done every 1-3 years. Bone density scan. This is done to screen for osteoporosis. You may have this done starting at age 23. Mammogram. This may be done every 1-2 years. Talk to your health care provider about how often you should have  regular mammograms. Talk with your health care provider about your test results, treatment options, and if necessary, the need for more tests. Vaccines  Your health care provider may recommend certain vaccines, such as: Influenza vaccine. This is recommended every year. Tetanus, diphtheria, and acellular pertussis (Tdap, Td) vaccine. You may need a Td  booster every 10 years. Zoster vaccine. You may need this after age 69. Pneumococcal 13-valent conjugate (PCV13) vaccine. One dose is recommended after age 42. Pneumococcal polysaccharide (PPSV23) vaccine. One dose is recommended after age 11. Talk to your health care provider about which screenings and vaccines you need and how often you need them. This information is not intended to replace advice given to you by your health care provider. Make sure you discuss any questions you have with your health care provider. Document Released: 08/05/2015 Document Revised: 03/28/2016 Document Reviewed: 05/10/2015 Elsevier Interactive Patient Education  2017 [employer] Prevention in the Home Falls can cause injuries. They can happen to people of all ages. There are many things you can do to make your home safe and to help prevent falls. What can I do on the outside of my home? Regularly fix the edges of walkways and driveways and fix any cracks. Remove anything that might make you trip as you walk through a door, such as a raised step or threshold. Trim any bushes or trees on the path to your home. Use bright outdoor lighting. Clear any walking paths of anything that might make someone trip, such as rocks or tools. Regularly check to see if handrails are loose or broken. Make sure that both sides of any steps have handrails. Any raised decks and porches should have guardrails on the edges. Have any leaves, snow, or ice cleared regularly. Use sand or salt on walking paths during winter. Clean up any spills in your garage right away. This includes oil or grease spills. What can I do in the bathroom? Use night lights. Install grab bars by the toilet and in the tub and shower. Do not use towel bars as grab bars. Use non-skid mats or decals in the tub or shower. If you need to sit down in the shower, use a plastic, non-slip stool. Keep the floor dry. Clean up any water that spills on the floor  as soon as it happens. Remove soap buildup in the tub or shower regularly. Attach bath mats securely with double-sided non-slip rug tape. Do not have throw rugs and other things on the floor that can make you trip. What can I do in the bedroom? Use night lights. Make sure that you have a light by your bed that is easy to reach. Do not use any sheets or blankets that are too big for your bed. They should not hang down onto the floor. Have a firm chair that has side arms. You can use this for support while you get dressed. Do not have throw rugs and other things on the floor that can make you trip. What can I do in the kitchen? Clean up any spills right away. Avoid walking on wet floors. Keep items that you use a lot in easy-to-reach places. If you need to reach something above you, use a strong step stool that has a grab bar. Keep electrical cords out of the way. Do not use floor polish or wax that makes floors slippery. If you must use wax, use non-skid floor wax. Do not have throw rugs and other things on the floor that  can make you trip. What can I do with my stairs? Do not leave any items on the stairs. Make sure that there are handrails on both sides of the stairs and use them. Fix handrails that are broken or loose. Make sure that handrails are as long as the stairways. Check any carpeting to make sure that it is firmly attached to the stairs. Fix any carpet that is loose or worn. Avoid having throw rugs at the top or bottom of the stairs. If you do have throw rugs, attach them to the floor with carpet tape. Make sure that you have a light switch at the top of the stairs and the bottom of the stairs. If you do not have them, ask someone to add them for you. What else can I do to help prevent falls? Wear shoes that: Do not have high heels. Have rubber bottoms. Are comfortable and fit you well. Are closed at the toe. Do not wear sandals. If you use a stepladder: Make sure that it is  fully opened. Do not climb a closed stepladder. Make sure that both sides of the stepladder are locked into place. Ask someone to hold it for you, if possible. Clearly mark and make sure that you can see: Any grab bars or handrails. First and last steps. Where the edge of each step is. Use tools that help you move around (mobility aids) if they are needed. These include: Canes. Walkers. Scooters. Crutches. Turn on the lights when you go into a dark area. Replace any light bulbs as soon as they burn out. Set up your furniture so you have a clear path. Avoid moving your furniture around. If any of your floors are uneven, fix them. If there are any pets around you, be aware of where they are. Review your medicines with your doctor. Some medicines can make you feel dizzy. This can increase your chance of falling. Ask your doctor what other things that you can do to help prevent falls. This information is not intended to replace advice given to you by your health care provider. Make sure you discuss any questions you have with your health care provider. Document Released: 05/05/2009 Document Revised: 12/15/2015 Document Reviewed: 08/13/2014 Elsevier Interactive Patient Education  2017 [employer].

## 2022-09-27 NOTE — Progress Notes (Signed)
Subjective:   Kristina Christian is Kristina 68 y.o. female who presents for Medicare Annual (Subsequent) preventive examination.  Review of Systems    Cardiac Risk Factors include: advanced age (>24mn, >>46women)     Objective:    Today's Vitals   09/27/22 1403  BP: 120/72  Weight: 95 lb 8 oz (43.3 kg)  Height: '5\' 4"'$  (1.626 m)   Body mass index is 16.39 kg/m.     09/27/2022    2:24 PM 05/06/2020    6:25 PM 05/20/2018    7:48 AM 08/22/2016    8:15 AM 09/19/2015    9:30 AM 03/22/2015    3:14 PM 02/07/2015   11:08 AM  Advanced Directives  Does Patient Have Kristina Medical Advance Directive? No No No No No No No  Would patient like information on creating Kristina medical advance directive? Yes (ED - Information included in AVS) No - Patient declined         Current Medications (verified) Outpatient Encounter Medications as of 09/27/2022  Medication Sig   b complex vitamins capsule Take 1 capsule by mouth daily.   calcium carbonate (OS-CAL) 600 MG TABS tablet Take 500 mg by mouth 2 (two) times daily.    cholecalciferol (VITAMIN D) 1000 units tablet Take 1,000 Units by mouth daily.   levocetirizine (XYZAL) 5 MG tablet Take 5 mg by mouth as needed for allergies.   magnesium oxide (MAG-OX) 400 MG tablet Take 400 mg by mouth daily.   MULTIPLE VITAMINS PO Take daily by mouth.    vitamin C (ASCORBIC ACID) 500 MG tablet Take 500 mg by mouth daily.   zinc gluconate 50 MG tablet Take 50 mg by mouth daily.   albuterol (PROVENTIL HFA;VENTOLIN HFA) 108 (90 Base) MCG/ACT inhaler Inhale 2 puffs every 4 (four) hours as needed into the lungs for wheezing or shortness of breath. (Patient not taking: Reported on 09/27/2022)   COVID-19 mRNA bivalent vaccine, Pfizer, (PFIZER COVID-19 VAC BIVALENT) injection Inject into the muscle. (Patient not taking: Reported on 09/27/2022)   COVID-19 mRNA vaccine 2023-2024 (COMIRNATY) syringe Inject into the muscle. (Patient not taking: Reported on 09/27/2022)   No facility-administered  encounter medications on file as of 09/27/2022.    Allergies (verified) Breo ellipta [fluticasone furoate-vilanterol]   History: Past Medical History:  Diagnosis Date   Acute upper respiratory infection    Allergy    Chronic bronchitis ([place] 06/11/2017   Fibrocystic breast    Osteoporosis    Sebaceous cyst    Synovial cyst    Varicose veins of lower extremity    Past Surgical History:  Procedure Laterality Date   COLONOSCOPY WITH PROPOFOL N/Kristina 05/20/2018   Procedure: COLONOSCOPY WITH PROPOFOL;  Surgeon: AJonathon Bellows MD;  [facility]  Service: Gastroenterology;  Laterality: N/Kristina;   TONSILLECTOMY AND ADENOIDECTOMY  04/22/2007   Family History  Problem Relation Age of Onset   Von Willebrand disease Mother    Osteoporosis Mother    Prostate cancer Brother    Bladder Cancer Maternal Grandfather    Social History   Socioeconomic History   Marital status: Married    Spouse name: AGwenlyn Christian  Number of children: 2   Years of education: Not on file   Highest education level: Not on file  Occupational History   Not on file  Tobacco Use   Smoking status: Never   Smokeless tobacco: Never  Vaping Use   Vaping Use: Never used  Substance and Sexual Activity   Alcohol use:  No    Alcohol/week: 0.0 standard drinks of alcohol   Drug use: No   Sexual activity: Yes    Partners: Male    Birth control/protection: Post-menopausal  Other Topics Concern   Not on file  Social History Narrative   Not on file   Social Determinants of Health   Financial Resource Strain: Low Risk  (09/27/2022)   Overall Financial Resource Strain (CARDIA)    Difficulty of Paying Living Expenses: Not hard at all  Food Insecurity: No Food Insecurity (09/27/2022)   Hunger Vital Sign    Worried About Running Out of Food in the Last Year: Never true    Ran Out of Food in the Last Year: Never true  Transportation Needs: No Transportation Needs (09/27/2022)   PRAPARE - Radiographer, therapeutic (Medical): No    Lack of Transportation (Non-Medical): No  Physical Activity: Inactive (09/27/2022)   Exercise Vital Sign    Days of Exercise per Week: 0 days    Minutes of Exercise per Session: 0 min  Stress: Stress Concern Present (09/27/2022)   [place]    Feeling of Stress : To some extent  Social Connections: Socially Integrated (09/27/2022)   Social Connection and Isolation Panel [NHANES]    Frequency of Communication with Friends and Family: More than three times Kristina week    Frequency of Social Gatherings with Friends and Family: More than three times Kristina week    Attends Religious Services: 1 to 4 times per year    Active Member of [employer] or Organizations: Yes    Attends Archivist Meetings: 1 to 4 times per year    Marital Status: Married    Tobacco Counseling Counseling given: Not Answered   Clinical Intake:  Pre-visit preparation completed: Yes  Pain : No/denies pain     BMI - recorded: 16.39 Nutritional Status: BMI <19  Underweight Nutritional Risks: None Diabetes: No  How often do you need to have someone help you when you read instructions, pamphlets, or other written materials from your doctor or pharmacy?: 1 - Never  Diabetic?no  Interpreter Needed?: No  Information entered by :: KristinaNaiara Lombardozzi,LPN   Activities of Daily Living    09/27/2022    2:24 PM 08/02/2022    1:03 PM  In your present state of health, do you have any difficulty performing the following activities:  Hearing? 0 0  Vision? 0 0  Difficulty concentrating or making decisions? 0 0  Walking or climbing stairs? 0 0  Dressing or bathing? 0 0  Doing errands, shopping? 0 0  Preparing Food and eating ? N   Using the Toilet? N   In the past six months, have you accidently leaked urine? N   Do you have problems with loss of bowel control? N   Managing your Medications? N   Managing your Finances? N    Housekeeping or managing your Housekeeping? N     Patient Care Team: Kristina Grana, PA-C as PCP - General (Family Medicine)  Indicate any recent Medical Services you may have received from other than [facility] providers in the past year (date may be approximate).     Assessment:   This is Kristina routine wellness examination for [place].  Hearing/Vision screen Hearing Screening - Comments:: Adequate hearing Vision Screening - Comments:: Adequate vision Myopia last visit  [place] [facility] Dr Edison Pace  Dietary issues and exercise activities discussed: Exercise limited by: None identified  Goals Addressed   None    Depression Screen    09/27/2022    2:14 PM 08/02/2022    1:03 PM 08/03/2021    8:06 AM 04/06/2021    8:48 AM 02/21/2021   11:30 AM 11/21/2020    3:43 PM 03/07/2020    8:33 AM  PHQ 2/9 Scores  PHQ - 2 Score 0 0 0 0 0 0 0  PHQ- 9 Score   0 0 0 0 0    Fall Risk    09/27/2022    2:07 PM 08/02/2022    1:03 PM 08/03/2021    8:06 AM 04/06/2021    8:48 AM 02/21/2021   11:29 AM  Fall Risk   Falls in the past year? 0 0 0 0 0  Number falls in past yr: 0 0 0 0 0  Injury with Fall? 0 0 0 0 0  Risk for fall due to : No Fall Risks  No Fall Risks    Follow up Education provided;Falls prevention discussed Falls evaluation completed Falls prevention discussed      FALL RISK PREVENTION PERTAINING TO THE HOME:  Any stairs in or around the home? Yes  If so, are there any without handrails? Yes  Home free of loose throw rugs in walkways, pet beds, electrical cords, etc? Yes  Adequate lighting in your home to reduce risk of falls? Yes   ASSISTIVE DEVICES UTILIZED TO PREVENT FALLS:  Life alert? No  Use of Kristina cane, walker or w/c? No  Grab bars in the bathroom? No  Shower chair or bench in shower? Yes  Elevated toilet seat or Kristina handicapped toilet? Yes   TIMED UP AND GO:  Was the test performed? Yes .  Length of time to ambulate 10 feet: 8 sec.   Gait steady and fast without use of assistive  device  Cognitive Function:        09/27/2022    2:25 PM  6CIT Screen  What Year? 0 points  What month? 0 points  What time? 0 points  Count back from 20 0 points  Months in reverse 0 points  Repeat phrase 0 points  Total Score 0 points    Immunizations Immunization History  Administered Date(s) Administered   COVID-19, mRNA, vaccine(Comirnaty)12 years and older 09/05/2022   PFIZER Comirnaty(Gray Top)Covid-19 Tri-Sucrose Vaccine 08/22/2020   PFIZER(Purple Top)SARS-COV-2 Vaccination 12/05/2019, 12/29/2019   PNEUMOCOCCAL CONJUGATE-20 04/27/2022   Pfizer Covid-19 Vaccine Bivalent Booster 30yr & up 12/05/2021   Tdap 04/22/2007, 01/06/2018   Tetanus 04/22/2007    TDAP status: Up to date  Flu Vaccine status: Declined, Education has been provided regarding the importance of this vaccine but patient still declined. Advised may receive this vaccine at local pharmacy or Health Dept. Aware to provide Kristina copy of the vaccination record if obtained from local pharmacy or Health Dept. Verbalized acceptance and understanding.  Pneumococcal vaccine status: Declined,  Education has been provided regarding the importance of this vaccine but patient still declined. Advised may receive this vaccine at local pharmacy or Health Dept. Aware to provide Kristina copy of the vaccination record if obtained from local pharmacy or Health Dept. Verbalized acceptance and understanding.   Covid-19 vaccine status: Completed vaccines  Qualifies for Shingles Vaccine? Yes   Zostavax completed No   Shingrix Completed?: No.    Education has been provided regarding the importance of this vaccine. Patient has been advised to call insurance company to determine out of pocket expense if they have not  yet received this vaccine. Advised may also receive vaccine at local pharmacy or Health Dept. Verbalized acceptance and understanding.  Screening Tests Health Maintenance  Topic Date Due   MAMMOGRAM  10/05/2018   COLONOSCOPY  (Pts 45-67yr Insurance coverage will need to be confirmed)  05/20/2021   INFLUENZA VACCINE  10/21/2022 (Originally 02/20/2022)   Zoster Vaccines- Shingrix (1 of 2) 11/01/2022 (Originally 04/05/1975)   COVID-19 Vaccine (6 - 2023-24 season) 10/31/2022   Medicare Annual Wellness (AWV)  09/27/2023   DEXA SCAN  07/25/2024   DTaP/Tdap/Td (4 - Td or Tdap) 01/07/2028   Pneumonia Vaccine 67 Years old  Completed   Hepatitis C Screening  Completed   HPV VACCINES  Aged Out    Health Maintenance  Health Maintenance Due  Topic Date Due   MAMMOGRAM  10/05/2018   COLONOSCOPY (Pts 45-495yrInsurance coverage will need to be confirmed)  05/20/2021    Colorectal cancer screening: Type of screening: Colonoscopy. Completed yes. Repeat every 5 years Due. This has already been ordered this as Kristina future order in the computer.   Mammogram status: Completed no. Repeat every year pt will let usKoreanow..when she desires  Bone Density status: Completed yes. Results reflect: Bone density results: OSTEOPOROSIS. Repeat every 5 years.  Lung Cancer Screening: (Low Dose CT Chest recommended if Age 67-80ears, 30 pack-year currently smoking OR have quit w/in 15years.) does not qualify.   Lung Cancer Screening Referral: no  Additional Screening:  Hepatitis C Screening: does not qualify; Completed no  Vision Screening: Recommended annual ophthalmology exams for early detection of glaucoma and other disorders of the eye. Is the patient up to date with their annual eye exam?  Yes  Who is the provider or what is the name of the office in which the patient attends annual eye exams? [facility] is not established with Kristina provider, would they like to be referred to Kristina provider to establish care? No .   Dental Screening: Recommended annual dental exams for proper oral hygiene  Community Resource Referral / Chronic Care Management: CRR required this visit?  No   CCM required this visit?  No      Plan:     I  have personally reviewed and noted the following in the patient's chart:   Medical and social history Use of alcohol, tobacco or illicit drugs  Current medications and supplements including opioid prescriptions. Patient is not currently taking opioid prescriptions. Functional ability and status Nutritional status Physical activity Advanced directives List of other physicians Hospitalizations, surgeries, and ER visits in previous 12 months Vitals Screenings to include cognitive, depression, and falls Referrals and appointments  In addition, I have reviewed and discussed with patient certain preventive protocols, quality metrics, and best practice recommendations. Kristina written personalized care plan for preventive services as well as general preventive health recommendations were provided to patient.     BrRoger ShelterLPN   [id number] Nurse Notes: pt is doing well herself but has some stressors with being caregiver for her mother. Pt says she does not need any help or counsel right now. Pt says she will schedule the MMG and colonoscopy when time permits.

## 2022-11-01 ENCOUNTER — Encounter: Payer: Self-pay | Admitting: Nurse Practitioner

## 2022-11-01 ENCOUNTER — Other Ambulatory Visit: Payer: Self-pay

## 2022-11-01 DIAGNOSIS — Z1211 Encounter for screening for malignant neoplasm of colon: Secondary | ICD-10-CM

## 2022-11-07 ENCOUNTER — Telehealth: Payer: Self-pay

## 2022-11-07 ENCOUNTER — Other Ambulatory Visit: Payer: Self-pay

## 2022-11-07 DIAGNOSIS — Z86010 Personal history of colonic polyps: Secondary | ICD-10-CM

## 2022-11-07 MED ORDER — GOLYTELY 236 G PO SOLR: 4000.0000 mL | Freq: Once | ORAL | 0 refills | Status: AC

## 2022-11-07 NOTE — Telephone Encounter (Signed)
Gastroenterology Pre-Procedure Review  Request Date: 12/07/22 Requesting Physician: Dr. Tobi Bastos  PATIENT REVIEW QUESTIONS: The patient responded to the following health history questions as indicated:    1. Are you having any GI issues? no 2. Do you have a personal history of Polyps? yes (last colonoscopy peformed by Dr. Tobi Bastos on 05/20/2018) 3. Do you have a family history of Colon Cancer or Polyps? no 4. Diabetes Mellitus? no 5. Joint replacements in the past 12 months?no 6. Major health problems in the past 3 months?no 7. Any artificial heart valves, MVP, or defibrillator?no    MEDICATIONS & ALLERGIES:    Patient reports the following regarding taking any anticoagulation/antiplatelet therapy:   Plavix, Coumadin, Eliquis, Xarelto, Lovenox, Pradaxa, Brilinta, or Effient? no Aspirin? no  Patient confirms/reports the following medications:  Current Outpatient Medications  Medication Sig Dispense Refill   albuterol (PROVENTIL HFA;VENTOLIN HFA) 108 (90 Base) MCG/ACT inhaler Inhale 2 puffs every 4 (four) hours as needed into the lungs for wheezing or shortness of breath. (Patient not taking: Reported on 09/27/2022) 1 Inhaler 1   b complex vitamins capsule Take 1 capsule by mouth daily.     calcium carbonate (OS-CAL) 600 MG TABS tablet Take 500 mg by mouth 2 (two) times daily.      cholecalciferol (VITAMIN D) 1000 units tablet Take 1,000 Units by mouth daily.     COVID-19 mRNA bivalent vaccine, Pfizer, (PFIZER COVID-19 VAC BIVALENT) injection Inject into the muscle. (Patient not taking: Reported on 09/27/2022) 0.3 mL 0   COVID-19 mRNA vaccine 2023-2024 (COMIRNATY) syringe Inject into the muscle. (Patient not taking: Reported on 09/27/2022) 0.3 mL 0   levocetirizine (XYZAL) 5 MG tablet Take 5 mg by mouth as needed for allergies.     magnesium oxide (MAG-OX) 400 MG tablet Take 400 mg by mouth daily.     MULTIPLE VITAMINS PO Take daily by mouth.      vitamin C (ASCORBIC ACID) 500 MG tablet Take 500 mg  by mouth daily.     zinc gluconate 50 MG tablet Take 50 mg by mouth daily.     No current facility-administered medications for this visit.    Patient confirms/reports the following allergies:  Allergies  Allergen Reactions   Breo Ellipta [Fluticasone Furoate-Vilanterol]     No orders of the defined types were placed in this encounter.   AUTHORIZATION INFORMATION Primary Insurance: 1D#: Group #:  Secondary Insurance: 1D#: Group #:  SCHEDULE INFORMATION: Date: 12/07/22 Time: Location: [facility]

## 2022-12-04 ENCOUNTER — Telehealth: Payer: Self-pay

## 2022-12-04 DIAGNOSIS — Z86010 Personal history of colonic polyps: Secondary | ICD-10-CM

## 2022-12-04 NOTE — Telephone Encounter (Signed)
Patient forgot to stop her vitamins and supplements 5 days before her colonoscopy.  She has been rescheduled to 02/01/23. Rosann Auerbach has been informed of date change.  Thanks, [place], [place]

## 2022-12-10 NOTE — Patient Instructions (Incomplete)
Preventive Care 65 Years and Older, Female Preventive care refers to lifestyle choices and visits with your health care provider that can promote health and wellness. Preventive care visits are also called wellness exams. What can I expect for my preventive care visit? Counseling Your health care provider may ask you questions about your: Medical history, including: Past medical problems. Family medical history. Pregnancy and menstrual history. History of falls. Current health, including: Memory and ability to understand (cognition). Emotional well-being. Home life and relationship well-being. Sexual activity and sexual health. Lifestyle, including: Alcohol, nicotine or tobacco, and drug use. Access to firearms. Diet, exercise, and sleep habits. Work and work environment. Sunscreen use. Safety issues such as seatbelt and bike helmet use. Physical exam Your health care provider will check your: Height and weight. These may be used to calculate your BMI (body mass index). BMI is a measurement that tells if you are at a healthy weight. Waist circumference. This measures the distance around your waistline. This measurement also tells if you are at a healthy weight and may help predict your risk of certain diseases, such as type 2 diabetes and high blood pressure. Heart rate and blood pressure. Body temperature. Skin for abnormal spots. What immunizations do I need?  Vaccines are usually given at various ages, according to a schedule. Your health care provider will recommend vaccines for you based on your age, medical history, and lifestyle or other factors, such as travel or where you work. What tests do I need? Screening Your health care provider may recommend screening tests for certain conditions. This may include: Lipid and cholesterol levels. Hepatitis C test. Hepatitis B test. HIV (human immunodeficiency virus) test. STI (sexually transmitted infection) testing, if you are at  risk. Lung cancer screening. Colorectal cancer screening. Diabetes screening. This is done by checking your blood sugar (glucose) after you have not eaten for a while (fasting). Mammogram. Talk with your health care provider about how often you should have regular mammograms. BRCA-related cancer screening. This may be done if you have a family history of breast, ovarian, tubal, or peritoneal cancers. Bone density scan. This is done to screen for osteoporosis. Talk with your health care provider about your test results, treatment options, and if necessary, the need for more tests. Follow these instructions at home: Eating and drinking  Eat a diet that includes fresh fruits and vegetables, whole grains, lean protein, and low-fat dairy products. Limit your intake of foods with high amounts of sugar, saturated fats, and salt. Take vitamin and mineral supplements as recommended by your health care provider. Do not drink alcohol if your health care provider tells you not to drink. If you drink alcohol: Limit how much you have to 0-1 drink a day. Know how much alcohol is in your drink. In the [place] one drink equals one 12 oz bottle of beer (355 mL), one 5 oz glass of wine (148 mL), or one 1 oz glass of hard liquor (44 mL). Lifestyle Brush your teeth every morning and night with fluoride toothpaste. Floss one time each day. Exercise for at least 30 minutes 5 or more days each week. Do not use any products that contain nicotine or tobacco. These products include cigarettes, chewing tobacco, and vaping devices, such as e-cigarettes. If you need help quitting, ask your health care provider. Do not use drugs. If you are sexually active, practice safe sex. Use a condom or other form of protection in order to prevent STIs. Take aspirin only as told by   your health care provider. Make sure that you understand how much to take and what form to take. Work with your health care provider to find out whether it  is safe and beneficial for you to take aspirin daily. Ask your health care provider if you need to take a cholesterol-lowering medicine (statin). Find healthy ways to manage stress, such as: Meditation, yoga, or listening to music. Journaling. Talking to a trusted person. Spending time with friends and family. Minimize exposure to UV radiation to reduce your risk of skin cancer. Safety Always wear your seat belt while driving or riding in a vehicle. Do not drive: If you have been drinking alcohol. Do not ride with someone who has been drinking. When you are tired or distracted. While texting. If you have been using any mind-altering substances or drugs. Wear a helmet and other protective equipment during sports activities. If you have firearms in your house, make sure you follow all gun safety procedures. What's next? Visit your health care provider once a year for an annual wellness visit. Ask your health care provider how often you should have your eyes and teeth checked. Stay up to date on all vaccines. This information is not intended to replace advice given to you by your health care provider. Make sure you discuss any questions you have with your health care provider. Document Revised: 01/04/2021 Document Reviewed: 01/04/2021 [employer] Patient Education  2023 [employer]   

## 2022-12-11 ENCOUNTER — Encounter: Payer: Self-pay | Admitting: Family Medicine

## 2022-12-11 ENCOUNTER — Ambulatory Visit (INDEPENDENT_AMBULATORY_CARE_PROVIDER_SITE_OTHER): Payer: Medicare HMO | Admitting: Family Medicine

## 2022-12-11 VITALS — BP 110/72 | HR 77 | Temp 97.6°F | Resp 16 | Ht 63.75 in | Wt 91.7 lb

## 2022-12-11 DIAGNOSIS — Z0000 Encounter for general adult medical examination without abnormal findings: Secondary | ICD-10-CM | POA: Diagnosis not present

## 2022-12-11 DIAGNOSIS — Z1211 Encounter for screening for malignant neoplasm of colon: Secondary | ICD-10-CM

## 2022-12-11 DIAGNOSIS — E46 Unspecified protein-calorie malnutrition: Secondary | ICD-10-CM | POA: Diagnosis not present

## 2022-12-11 DIAGNOSIS — Z1231 Encounter for screening mammogram for malignant neoplasm of breast: Secondary | ICD-10-CM

## 2022-12-11 DIAGNOSIS — J42 Unspecified chronic bronchitis: Secondary | ICD-10-CM

## 2022-12-11 DIAGNOSIS — G2581 Restless legs syndrome: Secondary | ICD-10-CM

## 2022-12-11 DIAGNOSIS — M810 Age-related osteoporosis without current pathological fracture: Secondary | ICD-10-CM | POA: Diagnosis not present

## 2022-12-11 DIAGNOSIS — R634 Abnormal weight loss: Secondary | ICD-10-CM

## 2022-12-11 MED ORDER — BOOST VERY HIGH CALORIE PO LIQD: 1.0000 | Freq: Every day | ORAL | 11 refills | Status: AC

## 2022-12-11 MED ORDER — GABAPENTIN 100 MG PO CAPS: 100.0000 mg | ORAL_CAPSULE | Freq: Every day | ORAL | 2 refills | Status: DC

## 2022-12-11 NOTE — Progress Notes (Signed)
Patient: Kristina Christian, Female    DOB: 1956/05/01, 67 y.o.   MRN: [id number] Danelle Berry, PA-C Visit Date: 12/11/2022  Today's Provider: Danelle Berry, PA-C   Chief Complaint  Patient presents with   Annual Exam   Subjective:   Annual physical exam:  Kristina Christian is a 67 y.o. female who presents today for complete physical exam:  Exercise/Activity:  not active or exercising Diet/nutrition:  very low weight/bmi - down a few lbs from baseline for the past couple years ~95, previously 102 Wt Readings from Last 5 Encounters:  12/11/22 91 lb 11.2 oz (41.6 kg)  09/27/22 95 lb 8 oz (43.3 kg)  08/02/22 93 lb 6.4 oz (42.4 kg)  12/12/21 95 lb (43.1 kg)  08/03/21 94 lb 14.4 oz (43 kg)   BMI Readings from Last 5 Encounters:  12/11/22 15.86 kg/m  09/27/22 16.39 kg/m  08/02/22 16.03 kg/m  12/12/21 16.31 kg/m  08/03/21 16.29 kg/m  Prior OV and f/up for weight - pt brought in detailed log of foods and calories - noted CPE 03/2021 "Was loosing weight - brings in a log of foods/calories - many days~1300 cal, others up to 2000 "   Sleep: not sleeping well due to leg sx  SDOH Screenings   Food Insecurity: No Food Insecurity (09/27/2022)  Housing: Low Risk  (09/27/2022)  Transportation Needs: No Transportation Needs (09/27/2022)  Utilities: Not At Risk (09/27/2022)  Alcohol Screen: Low Risk  (09/27/2022)  Depression (PHQ2-9): Low Risk  (12/11/2022)  Financial Resource Strain: Low Risk  (09/27/2022)  Physical Activity: Inactive (09/27/2022)  Social Connections: Socially Integrated (09/27/2022)  Stress: Stress Concern Present (09/27/2022)  Tobacco Use: Low Risk  (12/11/2022)      USPSTF grade A and B recommendations - reviewed and addressed today  Depression:  Phq 9 completed today by patient, was reviewed by me with patient in the room PHQ score is neg, pt feels good, but stressed    12/11/2022   10:54 AM 09/27/2022    2:14 PM 08/02/2022    1:03 PM 08/03/2021    8:06 AM  PHQ 2/9 Scores  PHQ  - 2 Score 0 0 0 0  PHQ- 9 Score 0   0      12/11/2022   10:54 AM 09/27/2022    2:14 PM 08/02/2022    1:03 PM 08/03/2021    8:06 AM 04/06/2021    8:48 AM  Depression screen PHQ 2/9  Decreased Interest 0 0 0 0 0  Down, Depressed, Hopeless 0 0 0 0 0  PHQ - 2 Score 0 0 0 0 0  Altered sleeping 0   0 0  Tired, decreased energy 0   0 0  Change in appetite 0   0 0  Feeling bad or failure about yourself  0   0 0  Trouble concentrating 0   0 0  Moving slowly or fidgety/restless 0   0 0  Suicidal thoughts 0   0 0  PHQ-9 Score 0   0 0  Difficult doing work/chores Not difficult at all   Not difficult at all Not difficult at all    Alcohol screening: Flowsheet Row Clinical [facility]  AUDIT-C Score 0       Immunizations and Health Maintenance: Health Maintenance  Topic Date Due   MAMMOGRAM  10/05/2018   COVID-19 Vaccine (6 - 2023-24 season) 12/27/2022 (Originally 10/31/2022)   Zoster Vaccines- Shingrix (1 of 2) 03/13/2023 (Originally  04/05/1975)   COLONOSCOPY (Pts 45-67yrs Insurance coverage will need to be confirmed)  12/11/2023 (Originally 05/20/2021)   INFLUENZA VACCINE  02/21/2023   Medicare Annual Wellness (AWV)  09/27/2023   DEXA SCAN  07/25/2024   DTaP/Tdap/Td (4 - Td or Tdap) 01/07/2028   Pneumonia Vaccine 16+ Years old  Completed   Hepatitis C Screening  Completed   HPV VACCINES  Aged Out     Hep C Screening:  done  STD testing and prevention (HIV/chl/gon/syphilis):  see above, no additional testing desired by pt today  Intimate partner violence:  safe  Sexual History/Pain during Intercourse: Married, denies discomfort  Menstrual History/LMP/Abnormal Bleeding:  none/denies No LMP recorded. Patient is postmenopausal.  Incontinence Symptoms: none, some nocturia  Breast cancer:   mammogram - she has been unwilling to do because of pain Ordered - pt will call and set up herself Last Mammogram: *see HM list above BRCA  gene screening:   Cervical cancer screening: aged out  Osteoporosis:   Discussion on osteoporosis per age, including high calcium and vitamin D supplementation, weight bearing exercises Pt is  supplementing with daily calcium/Vit D. 07/2022 reviewed - slight worsening osteoporosis to spine/hip - reviewed with pt her last Bone scan/dexa Last vitamin D Lab Results  Component Value Date   VD25OH 38 08/02/2022  On vit d 1000 IU  On calcium 500-600 mg supplement Thinking about adding K2  Skin cancer:  Hx of skin CA -  NO  Discussed atypical lesions  - None Eye bump right lower eyelid - she has appt set up with eye doctor  Colorectal cancer:   Colonoscopy is due - she was referred but had a conflict and has rescheduled Discussed concerning signs and sx of CRC, pt denies change in bowels  Lung cancer:   Low Dose CT Chest recommended if Age 27-80 years, 20 pack-year currently smoking OR have quit w/in 15years. Patient does not qualify.    Social History   Tobacco Use   Smoking status: Never   Smokeless tobacco: Never  Vaping Use   Vaping Use: Never used  Substance Use Topics   Alcohol use: Never   Drug use: Never     Flowsheet Row Clinical Support [facility] 09/27/20[facility] in [facility]  AUDIT-C Score 0       Family History  Problem Relation Age of Onset   Von Willebrand disease Mother    Osteoporosis Mother    Arthritis Mother    Hearing loss Mother    Prostate cancer Brother    Cancer Brother    Bladder Cancer Maternal Grandfather    Cancer Maternal Grandfather    Asthma Sister      Blood pressure/Hypertension: BP Readings from Last 3 Encounters:  12/11/22 110/72  09/27/22 120/72  08/02/22 112/72    Weight/Obesity: Wt Readings from Last 3 Encounters:  12/11/22 91 lb 11.2 oz (41.6 kg)  09/27/22 95 lb 8 oz (43.3 kg)  08/02/22 93 lb 6.4 oz (42.4 kg)   BMI Readings from Last 3 Encounters:  12/11/22 15.86 kg/m  09/27/22 16.39 kg/m   08/02/22 16.03 kg/m     Lipids:  Lab Results  Component Value Date   CHOL 180 08/02/2022   CHOL 170 02/21/2021   CHOL 182 01/06/2018   Lab Results  Component Value Date   HDL 73 08/02/2022   HDL 71 02/21/2021   HDL 71 01/06/2018   Lab Results  Component Value Date   LDLCALC 88 08/02/2022  LDLCALC 80 02/21/2021   LDLCALC 94 01/06/2018   Lab Results  Component Value Date   TRIG 95 08/02/2022   TRIG 103 02/21/2021   TRIG 77 01/06/2018   Lab Results  Component Value Date   CHOLHDL 2.5 08/02/2022   CHOLHDL 2.4 02/21/2021   CHOLHDL 2.6 01/06/2018   No results found for: "LDLDIRECT" Based on the results of lipid panel his/her cardiovascular risk factor ( using [facility] )  in the next 10 years is: The 10-year ASCVD risk score (Arnett DK, et al., 2019) is: 4%   Values used to calculate the score:     Age: 33 years     Sex: Female     Is Non-Hispanic African American: No     Diabetic: No     Tobacco smoker: No     Systolic Blood Pressure: 110 mmHg     Is BP treated: No     HDL Cholesterol: 73 mg/dL     Total Cholesterol: 180 mg/dL  Glucose:  Glucose, Bld  Date Value Ref Range Status  08/02/2022 96 65 - 99 mg/dL Final    Comment:    .            Fasting reference interval .   08/03/2021 64 (L) 65 - 99 mg/dL Final    Comment:    .            Fasting reference interval .   02/21/2021 64 (L) 65 - 99 mg/dL Final    Comment:    .            Fasting reference interval .     Advanced Care Planning:  A voluntary discussion about advance care planning including the explanation and discussion of advance directives.   Discussed health care proxy and Living will, and the patient was able to identify a health care proxy as albert.   Patient does not have a living will at present time. Has papers and its almost complete - she will bring in a copy when completed  Social History       Social History   Socioeconomic History   Marital status: Married     Spouse name: Mathis Fare   Number of children: 2   Years of education: Not on file   Highest education level: Not on file  Occupational History   Not on file  Tobacco Use   Smoking status: Never   Smokeless tobacco: Never  Vaping Use   Vaping Use: Never used  Substance and Sexual Activity   Alcohol use: Never   Drug use: Never   Sexual activity: Yes    Partners: Male    Birth control/protection: Post-menopausal  Other Topics Concern   Not on file  Social History Narrative   Not on file   Social Determinants of Health   Financial Resource Strain: Low Risk  (09/27/2022)   Overall Financial Resource Strain (CARDIA)    Difficulty of Paying Living Expenses: Not hard at all  Food Insecurity: No Food Insecurity (09/27/2022)   Hunger Vital Sign    Worried About Running Out of Food in the Last Year: Never true    Ran Out of Food in the Last Year: Never true  Transportation Needs: No Transportation Needs (09/27/2022)   PRAPARE - Administrator, Civil Service (Medical): No    Lack of Transportation (Non-Medical): No  Physical Activity: Inactive (09/27/2022)   Exercise Vital Sign    Days of Exercise per Week:  0 days    Minutes of Exercise per Session: 0 min  Stress: Stress Concern Present (09/27/2022)   [employer] of Occupational Health - Occupational Stress Questionnaire    Feeling of Stress : To some extent  Social Connections: Socially Integrated (09/27/2022)   Social Connection and Isolation Panel [NHANES]    Frequency of Communication with Friends and Family: More than three times a week    Frequency of Social Gatherings with Friends and Family: More than three times a week    Attends Religious Services: 1 to 4 times per year    Active Member of [employer] or Organizations: Yes    Attends Banker Meetings: 1 to 4 times per year    Marital Status: Married    Family History        Family History  Problem Relation Age of Onset   Von Willebrand disease Mother     Osteoporosis Mother    Arthritis Mother    Hearing loss Mother    Prostate cancer Brother    Cancer Brother    Bladder Cancer Maternal Grandfather    Cancer Maternal Grandfather    Asthma Sister     Patient Active Problem List   Diagnosis Date Noted   Recurrent cough 12/12/2021   Unintentional weight loss of 5% body weight or less within 1 month 02/21/2021   Protein-calorie malnutrition (HCC) 02/21/2021   Anserine bursitis 03/07/2020   Venous stasis dermatitis of right lower extremity 10/06/2019   Preventative health care 05/05/2018   Spider vein of lower extremity 01/06/2018   Chronic bronchitis with acute exacerbation (HCC) 06/11/2017   Chronic bronchitis (HCC) 06/11/2017   Disorder of uvula 08/22/2016   Nodule of finger of both hands 09/19/2015   Pharyngeal disorder 09/19/2015   Sun-induced skin changes, keratosis 03/22/2015   Osteoporosis 01/27/2015   Varicose veins of leg with pain, bilateral 01/27/2015   Allergic rhinitis, seasonal 01/27/2015   RAD (reactive airway disease) 01/27/2015    Past Surgical History:  Procedure Laterality Date   COLONOSCOPY WITH PROPOFOL N/A 05/20/2018   Procedure: COLONOSCOPY WITH PROPOFOL;  Surgeon: Wyline Mood, MD;  Location: [facility] ENDOSCOPY;  Service: Gastroenterology;  Laterality: N/A;   TONSILLECTOMY AND ADENOIDECTOMY  04/22/2007     Current Outpatient Medications:    albuterol (PROVENTIL HFA;VENTOLIN HFA) 108 (90 Base) MCG/ACT inhaler, Inhale 2 puffs every 4 (four) hours as needed into the lungs for wheezing or shortness of breath., Disp: 1 Inhaler, Rfl: 1   b complex vitamins capsule, Take 1 capsule by mouth daily., Disp: , Rfl:    calcium carbonate (OS-CAL) 600 MG TABS tablet, Take 500 mg by mouth 2 (two) times daily. , Disp: , Rfl:    cholecalciferol (VITAMIN D) 1000 units tablet, Take 1,000 Units by mouth daily., Disp: , Rfl:    COVID-19 mRNA bivalent vaccine, Pfizer, (PFIZER COVID-19 VAC BIVALENT) injection, Inject into the  muscle., Disp: 0.3 mL, Rfl: 0   COVID-19 mRNA vaccine 2023-2024 (COMIRNATY) syringe, Inject into the muscle., Disp: 0.3 mL, Rfl: 0   levocetirizine (XYZAL) 5 MG tablet, Take 5 mg by mouth as needed for allergies., Disp: , Rfl:    magnesium oxide (MAG-OX) 400 MG tablet, Take 400 mg by mouth daily., Disp: , Rfl:    MULTIPLE VITAMINS PO, Take daily by mouth. , Disp: , Rfl:    vitamin C (ASCORBIC ACID) 500 MG tablet, Take 500 mg by mouth daily., Disp: , Rfl:    zinc gluconate 50 MG tablet, Take 50  mg by mouth daily., Disp: , Rfl:    GAVILYTE-G 236 g solution, SMARTSIG:Milliliter(s) By Mouth (Patient not taking: Reported on 12/11/2022), Disp: , Rfl:   Allergies  Allergen Reactions   Breo Ellipta [Fluticasone Furoate-Vilanterol]     Patient Care Team: Danelle Berry, PA-C as PCP - General (Family Medicine)   Chart Review: I personally reviewed active problem list, medication list, allergies, family history, social history, health maintenance, notes from last encounter, lab results, imaging with the patient/caregiver today.   Review of Systems  Constitutional: Negative.   HENT: Negative.    Eyes: Negative.   Respiratory: Negative.    Cardiovascular: Negative.   Gastrointestinal: Negative.   Endocrine: Negative.   Genitourinary: Negative.   Musculoskeletal: Negative.   Skin: Negative.   Allergic/Immunologic: Negative.   Neurological: Negative.   Hematological: Negative.   Psychiatric/Behavioral: Negative.    All other systems reviewed and are negative.         Objective:   Vitals:  Vitals:   12/11/22 1102  BP: 110/72  Pulse: 77  Resp: 16  Temp: 97.6 F (36.4 C)  TempSrc: Oral  SpO2: 98%  Weight: 91 lb 11.2 oz (41.6 kg)  Height: 5' 3.75" (1.619 m)    Body mass index is 15.86 kg/m.  Physical Exam Vitals and nursing note reviewed.  Constitutional:      General: She is not in acute distress.    Appearance: Normal appearance. She is well-developed, well-groomed and  underweight. She is not ill-appearing, toxic-appearing or diaphoretic.     Comments: Thin appearing, alert  HENT:     Head: Normocephalic and atraumatic.     Right Ear: Tympanic membrane, ear canal and external ear normal. There is no impacted cerumen.     Left Ear: Tympanic membrane, ear canal and external ear normal. There is no impacted cerumen.     Nose: Nose normal.     Mouth/Throat:     Mouth: Mucous membranes are moist.     Pharynx: Oropharynx is clear. No oropharyngeal exudate or posterior oropharyngeal erythema.  Eyes:     General: Lids are normal. No scleral icterus.       Right eye: No discharge.        Left eye: No discharge.     Conjunctiva/sclera: Conjunctivae normal.     Pupils: Pupils are equal, round, and reactive to light.  Neck:     Trachea: Phonation normal. No tracheal deviation.  Cardiovascular:     Rate and Rhythm: Normal rate and regular rhythm.     Pulses: Normal pulses.          Radial pulses are 2+ on the right side and 2+ on the left side.       Posterior tibial pulses are 2+ on the right side and 2+ on the left side.     Heart sounds: Normal heart sounds. No murmur heard.    No friction rub. No gallop.  Pulmonary:     Effort: Pulmonary effort is normal. No respiratory distress.     Breath sounds: Normal breath sounds. No stridor. No wheezing, rhonchi or rales.  Chest:     Chest wall: No tenderness.  Abdominal:     General: Bowel sounds are normal. There is no distension.     Palpations: Abdomen is soft. There is no mass.     Tenderness: There is no abdominal tenderness. There is no right CVA tenderness, left CVA tenderness or guarding.  Musculoskeletal:     Right lower leg:  No edema.     Left lower leg: No edema.  Skin:    General: Skin is warm and dry.     Capillary Refill: Capillary refill takes less than 2 seconds.     Coloration: Skin is not jaundiced or pale.     Findings: No rash.  Neurological:     Mental Status: She is alert. Mental  status is at baseline.     Motor: No abnormal muscle tone.     Gait: Gait normal.  Psychiatric:        Attention and Perception: Attention normal.        Mood and Affect: Mood is anxious. Mood is not depressed.        Speech: Speech normal.        Behavior: Behavior normal. Behavior is cooperative.        Thought Content: Thought content normal.     Comments: Fidgety, slightly hyperactive       Fall Risk:    12/11/2022   10:54 AM 09/27/2022    2:07 PM 08/02/2022    1:03 PM 08/03/2021    8:06 AM 04/06/2021    8:48 AM  Fall Risk   Falls in the past year? 0 0 0 0 0  Number falls in past yr: 0 0 0 0 0  Injury with Fall? 0 0 0 0 0  Risk for fall due to : No Fall Risks No Fall Risks  No Fall Risks   Follow up Falls prevention discussed;Education provided;Falls evaluation completed Education provided;Falls prevention discussed Falls evaluation completed Falls prevention discussed     Functional Status Survey: Is the patient deaf or have difficulty hearing?: No Does the patient have difficulty seeing, even when wearing glasses/contacts?: Yes Does the patient have difficulty concentrating, remembering, or making decisions?: No Does the patient have difficulty walking or climbing stairs?: No Does the patient have difficulty dressing or bathing?: No Does the patient have difficulty doing errands alone such as visiting a doctor's office or shopping?: No   Assessment & Plan:    CPE completed today  USPSTF grade A and B recommendations reviewed with patient; age-appropriate recommendations, preventive care, screening tests, etc discussed and encouraged; healthy living encouraged; see AVS for patient education given to patient  Discussed importance of 150 minutes of physical activity weekly, AHA exercise recommendations given to pt in AVS/handout  Discussed importance of healthy diet:  eating lean meats and proteins, avoiding trans fats and saturated fats, avoid simple sugars and excessive  carbs in diet, eat 6 servings of fruit/vegetables daily and drink plenty of water and avoid sweet beverages.    Recommended pt to do annual eye exam and routine dental exams/cleanings  Depression, alcohol, fall screening completed as documented above and per flowsheets  Advance Care planning information and packet discussed and offered today, encouraged pt to discuss with family members/spouse/partner/friends and complete Advanced directive packet and bring copy to office   Reviewed Health Maintenance: Mammo ordered Colonoscopy referral and procedure set up, declines shingrix Health Maintenance  Topic Date Due   MAMMOGRAM  10/05/2018   COVID-19 Vaccine (6 - 2023-24 season) 12/27/2022 (Originally 10/31/2022)   Zoster Vaccines- Shingrix (1 of 2) 03/13/2023 (Originally 04/05/1975)   COLONOSCOPY (Pts 45-1yrs Insurance coverage will need to be confirmed)  12/11/2023 (Originally 05/20/2021)   INFLUENZA VACCINE  02/21/2023   Medicare Annual Wellness (AWV)  09/27/2023   DEXA SCAN  07/25/2024   DTaP/Tdap/Td (4 - Td or Tdap) 01/07/2028   Pneumonia Vaccine 65+  Years old  Completed   Hepatitis C Screening  Completed   HPV VACCINES  Aged Out    Immunizations: Immunization History  Administered Date(s) Administered   COVID-19, mRNA, vaccine(Comirnaty)12 years and older 09/05/2022   PFIZER Comirnaty(Gray Top)Covid-19 Tri-Sucrose Vaccine 08/22/2020   PFIZER(Purple Top)SARS-COV-2 Vaccination 12/05/2019, 12/29/2019   PNEUMOCOCCAL CONJUGATE-20 04/27/2022   Pfizer Covid-19 Vaccine Bivalent Booster 27yrs & up 12/05/2021   Tdap 04/22/2007, 01/06/2018   Tetanus 04/22/2007   Vaccines:  HPV: up to at age 58 , ask insurance if age between 45-45  Shingrix: 71-64 yo and ask insurance if covered when patient above 29 yo Pneumonia: done- educated and discussed with patient. Flu: UTD educated and discussed with patient.     ICD-10-CM   1. Annual physical exam  Z00.00 COMPLETE METABOLIC PANEL WITH GFR     CBC with Differential/Platelet    Thyroid Panel With TSH    2. Age-related osteoporosis without current pathological fracture  M81.0 COMPLETE METABOLIC PANEL WITH GFR    Amb ref to Medical Nutrition Therapy-MNT   pending endocrinology referral - has done fosamax for years, last dexa shows slight worsening, on supplements, low weight, pending consult    3. Unintentional weight loss  R63.4 COMPLETE METABOLIC PANEL WITH GFR    CBC with Differential/Platelet    Thyroid Panel With TSH    Nutritional Supplements (BOOST VERY HIGH CALORIE) LIQD    Amb ref to Medical Nutrition Therapy-MNT   discussed again increasing calories, very healthy diet, add fats, recommended formal MNT consult, on 1 boost only 250 cal extra, cannot afford more    4. RLS (restless legs syndrome)  G25.81 gabapentin (NEURONTIN) 100 MG capsule   disrupting sleep, previously worked up extensively, trial of gabapentin, could try lyrica if she doesn't like gabapentin, weight gain SE would be wanted    5. Protein-calorie malnutrition, unspecified severity (HCC) Chronic E46 Amb ref to Medical Nutrition Therapy-MNT   see above - discussed weightloss, discussed work up and impact to health, she agrees to consult    6. Chronic bronchitis, unspecified chronic bronchitis type (HCC) Chronic J42    sx currently stable, no recent exacerbations, pulm consult reviewed    7. Encounter for screening mammogram for malignant neoplasm of breast  Z12.31    ordered previously, discussed today - she is concerned with comfort and being treated/handled gently    8. Screening for malignant neoplasm of colon  Z12.11    set up with GI and currently pending      Boosts - one a day - protein vanilla has 250 cal, recommended very high calorie supplement instead of her current one - hopefully MNT/RD consult and we'll do a 3 month f/up unless pt needs to come sooner (more weight loss, med SE for RLS)    Danelle Berry, PA-C 12/11/22 11:15  AM  [facility]

## 2022-12-12 LAB — COMPLETE METABOLIC PANEL WITH GFR
AG Ratio: 1.8 (calc) (ref 1.0–2.5)
ALT: 18 U/L (ref 6–29)
AST: 19 U/L (ref 10–35)
Albumin: 4.5 g/dL (ref 3.6–5.1)
Alkaline phosphatase (APISO): 57 U/L (ref 37–153)
BUN: 21 mg/dL (ref 7–25)
CO2: 31 mmol/L (ref 20–32)
Calcium: 9.7 mg/dL (ref 8.6–10.4)
Chloride: 104 mmol/L (ref 98–110)
Creat: 0.76 mg/dL (ref 0.50–1.05)
Globulin: 2.5 g/dL (calc) (ref 1.9–3.7)
Glucose, Bld: 72 mg/dL (ref 65–99)
Potassium: 4.6 mmol/L (ref 3.5–5.3)
Sodium: 142 mmol/L (ref 135–146)
Total Bilirubin: 0.5 mg/dL (ref 0.2–1.2)
Total Protein: 7 g/dL (ref 6.1–8.1)
eGFR: 86 mL/min/{1.73_m2} (ref >=60)

## 2022-12-12 LAB — CBC WITH DIFFERENTIAL/PLATELET
Absolute Monocytes: 496 cells/uL (ref 200–950)
Basophils Absolute: 32 cells/uL (ref 0–200)
Basophils Relative: 0.8 %
Eosinophils Absolute: 132 cells/uL (ref 15–500)
Eosinophils Relative: 3.3 %
HCT: 39.9 % (ref 35.0–45.0)
Hemoglobin: 13.6 g/dL (ref 11.7–15.5)
Lymphs Abs: 980 cells/uL (ref 850–3900)
MCH: 33.5 pg — ABNORMAL HIGH (ref 27.0–33.0)
MCHC: 34.1 g/dL (ref 32.0–36.0)
MCV: 98.3 fL (ref 80.0–100.0)
MPV: 10.4 fL (ref 7.5–12.5)
Monocytes Relative: 12.4 %
Neutro Abs: 2360 cells/uL (ref 1500–7800)
Neutrophils Relative %: 59 %
Platelets: 185 10*3/uL (ref 140–400)
RBC: 4.06 10*6/uL (ref 3.80–5.10)
RDW: 11.7 % (ref 11.0–15.0)
Total Lymphocyte: 24.5 %
WBC: 4 10*3/uL (ref 3.8–10.8)

## 2022-12-12 LAB — THYROID PANEL WITH TSH
Free Thyroxine Index: 2.3 (ref 1.4–3.8)
T3 Uptake: 29 % (ref 22–35)
T4, Total: 7.9 ug/dL (ref 5.1–11.9)
TSH: 1.23 mIU/L (ref 0.40–4.50)

## 2022-12-18 ENCOUNTER — Telehealth: Payer: Self-pay | Admitting: Family Medicine

## 2022-12-18 NOTE — Telephone Encounter (Signed)
Copied from CRM [id number]. Topic: General - Other >> Dec 18, 2022 11:48 AM Franchot Heidelberg wrote: Reason for CRM: Pt called reporting that she read the medication guide and does not want to take the Gabapentin. She is afraid of developing depression, suicidal thoughts, fatigue, trouble sleeping, acting on dangerous impulses... these are all on the list of things she could potentially face taking the Rx.

## 2022-12-18 NOTE — Telephone Encounter (Signed)
FYI

## 2022-12-27 DIAGNOSIS — M810 Age-related osteoporosis without current pathological fracture: Secondary | ICD-10-CM | POA: Diagnosis not present

## 2022-12-27 DIAGNOSIS — R636 Underweight: Secondary | ICD-10-CM | POA: Diagnosis not present

## 2022-12-27 DIAGNOSIS — E46 Unspecified protein-calorie malnutrition: Secondary | ICD-10-CM | POA: Diagnosis not present

## 2023-01-31 ENCOUNTER — Encounter: Payer: Self-pay | Admitting: Gastroenterology

## 2023-01-31 DIAGNOSIS — H2513 Age-related nuclear cataract, bilateral: Secondary | ICD-10-CM | POA: Diagnosis not present

## 2023-01-31 DIAGNOSIS — H43811 Vitreous degeneration, right eye: Secondary | ICD-10-CM | POA: Diagnosis not present

## 2023-02-01 ENCOUNTER — Encounter: Payer: Self-pay | Admitting: Gastroenterology

## 2023-02-01 ENCOUNTER — Encounter
Admission: RE | Disposition: A | Discharge status: Home / Self Care | Payer: Self-pay | Source: Home / Self Care | Attending: Gastroenterology

## 2023-02-01 ENCOUNTER — Ambulatory Visit: Payer: Medicare HMO | Admitting: Anesthesiology

## 2023-02-01 ENCOUNTER — Ambulatory Visit
Admission: RE | Admit: 2023-02-01 | Discharge: 2023-02-01 | Disposition: A | Discharge status: Home / Self Care | Payer: Medicare HMO | Attending: Gastroenterology | Admitting: Gastroenterology

## 2023-02-01 DIAGNOSIS — K5730 Diverticulosis of large intestine without perforation or abscess without bleeding: Secondary | ICD-10-CM | POA: Diagnosis not present

## 2023-02-01 DIAGNOSIS — Z860100 Personal history of colon polyps, unspecified: Secondary | ICD-10-CM

## 2023-02-01 DIAGNOSIS — Z1211 Encounter for screening for malignant neoplasm of colon: Secondary | ICD-10-CM | POA: Diagnosis not present

## 2023-02-01 HISTORY — PX: COLONOSCOPY WITH PROPOFOL: SHX5780

## 2023-02-01 SURGERY — COLONOSCOPY WITH PROPOFOL
Anesthesia: General

## 2023-02-01 MED ORDER — PROPOFOL 1000 MG/100ML IV EMUL
INTRAVENOUS | Status: AC
  Filled 2023-02-01: qty 100

## 2023-02-01 MED ORDER — LIDOCAINE HCL (CARDIAC) PF 100 MG/5ML IV SOSY
PREFILLED_SYRINGE | INTRAVENOUS | Status: DC | PRN
  Administered 2023-02-01: 40 mg via INTRAVENOUS

## 2023-02-01 MED ORDER — SODIUM CHLORIDE 0.9 % IV SOLN: INTRAVENOUS | Status: DC

## 2023-02-01 MED ORDER — PROPOFOL 10 MG/ML IV BOLUS
INTRAVENOUS | Status: DC | PRN
  Administered 2023-02-01: 70 mg via INTRAVENOUS

## 2023-02-01 MED ORDER — LIDOCAINE HCL (PF) 2 % IJ SOLN
INTRAMUSCULAR | Status: AC
  Filled 2023-02-01: qty 5

## 2023-02-01 MED ORDER — PROPOFOL 500 MG/50ML IV EMUL
INTRAVENOUS | Status: DC | PRN
  Administered 2023-02-01: 125 ug/kg/min via INTRAVENOUS

## 2023-02-01 NOTE — Anesthesia Preprocedure Evaluation (Signed)
Anesthesia Evaluation  Patient identified by MRN, date of birth, ID band Patient awake    Reviewed: Allergy & Precautions, NPO status , Patient's Chart, lab work & pertinent test results  Airway Mallampati: III  TM Distance: <3 FB Neck ROM: Full    Dental  (+) Teeth Intact   Pulmonary neg pulmonary ROS   Pulmonary exam normal breath sounds clear to auscultation       Cardiovascular Exercise Tolerance: Good negative cardio ROS Normal cardiovascular exam Rate:Normal     Neuro/Psych negative neurological ROS  negative psych ROS   GI/Hepatic negative GI ROS, Neg liver ROS,,,  Endo/Other  negative endocrine ROS    Renal/GU negative Renal ROS  negative genitourinary   Musculoskeletal   Abdominal Normal abdominal exam  (+)   Peds negative pediatric ROS (+)  Hematology negative hematology ROS (+)   Anesthesia Other Findings Past Medical History: No date: Acute upper respiratory infection No date: Allergy No date: Arthritis     Comment:  fingers 06/11/2017: Chronic bronchitis (HCC) Seasonal: COPD (chronic obstructive pulmonary disease) (HCC)     Comment:  bronchitis No date: Fibrocystic breast 09/19/2015: Nodule of finger of both hands No date: Osteoporosis No date: Sebaceous cyst 03/22/2015: Sun-induced skin changes, keratosis No date: Synovial cyst No date: Varicose veins of lower extremity  Past Surgical History: 05/20/2018: COLONOSCOPY WITH PROPOFOL; N/A     Comment:  Procedure: COLONOSCOPY WITH PROPOFOL;  Surgeon: Wyline Mood, MD;  Location: [facility] ENDOSCOPY;  Service:               Gastroenterology;  Laterality: N/A; 04/22/2007: TONSILLECTOMY AND ADENOIDECTOMY  BMI    Body Mass Index: 15.86 kg/m      Reproductive/Obstetrics negative OB ROS                             Anesthesia Physical Anesthesia Plan  ASA: 2  Anesthesia Plan: General   Post-op Pain  Management:    Induction: Intravenous  PONV Risk Score and Plan: Propofol infusion and TIVA  Airway Management Planned: Natural Airway  Additional Equipment:   Intra-op Plan:   Post-operative Plan:   Informed Consent: I have reviewed the patients History and Physical, chart, labs and discussed the procedure including the risks, benefits and alternatives for the proposed anesthesia with the patient or authorized representative who has indicated his/her understanding and acceptance.     Dental Advisory Given  Plan Discussed with: CRNA and Surgeon  Anesthesia Plan Comments:        Anesthesia Quick Evaluation

## 2023-02-01 NOTE — Anesthesia Postprocedure Evaluation (Signed)
Anesthesia Post Note  Patient: Kristina Christian  Procedure(s) Performed: COLONOSCOPY WITH PROPOFOL  Patient location during evaluation: PACU Anesthesia Type: General Level of consciousness: awake and alert Pain management: pain level controlled Vital Signs Assessment: post-procedure vital signs reviewed and stable Respiratory status: spontaneous breathing and nonlabored ventilation Cardiovascular status: blood pressure returned to baseline Anesthetic complications: no   No notable events documented.   Last Vitals:  Vitals:   02/01/23 1040 02/01/23 1047  BP: (!) 76/37 (!) 85/47  Pulse: 72 65  Resp: 18 16  Temp: (!) 35.9 C   SpO2:  96%    Last Pain:  Vitals:   02/01/23 1040  TempSrc: Temporal  PainSc:                  VAN STAVEREN,Marlen Mollica

## 2023-02-01 NOTE — Transfer of Care (Signed)
Immediate Anesthesia Transfer of Care Note  Patient: Kristina Christian  Procedure(s) Performed: COLONOSCOPY WITH PROPOFOL  Patient Location: PACU and Endoscopy Unit  Anesthesia Type:General  Level of Consciousness: awake  Airway & Oxygen Therapy: Patient Spontanous Breathing  Post-op Assessment: Report given to RN  Post vital signs: stable  Last Vitals:  Vitals Value Taken Time  BP 85/47 02/01/23 1042  Temp 35.9 C 02/01/23 1040  Pulse 67 02/01/23 1045  Resp 18 02/01/23 1045  SpO2 97 % 02/01/23 1045  Vitals shown include unfiled device data.  Last Pain:  Vitals:   02/01/23 1040  TempSrc: Temporal  PainSc:          Complications: No notable events documented.

## 2023-02-01 NOTE — Op Note (Signed)
[facility] Gastroenterology Patient Name: Kristina Christian Procedure Date: 02/01/2023 10:03 AM MRN: [id number] Account #: [id number] Date of Birth: Nov 05, 1955 Admit Type: Outpatient Age: 67 Room: [facility] ENDO ROOM 4 Gender: Female Note Status: Finalized Instrument Name: Peds Colonoscope [id number] Procedure:             Colonoscopy Indications:           Surveillance: Personal history of adenomatous polyps                         on last colonoscopy > 3 years ago, Last colonoscopy:                         October 2019 Providers:             Wyline Mood MD, MD Referring MD:          Wyline Mood MD, MD (Referring MD) Medicines:             Monitored Anesthesia Care Complications:         No immediate complications. Procedure:             Pre-Anesthesia Assessment:                        - Prior to the procedure, a History and Physical was                         performed, and patient medications, allergies and                         sensitivities were reviewed. The patient's tolerance                         of previous anesthesia was reviewed.                        - The risks and benefits of the procedure and the                         sedation options and risks were discussed with the                         patient. All questions were answered and informed                         consent was obtained.                        - After reviewing the risks and benefits, the patient                         was deemed in satisfactory condition to undergo the                         procedure.                        - ASA Grade Assessment: II - A patient with mild  systemic disease.                        After obtaining informed consent, the colonoscope was                         passed under direct vision. Throughout the procedure,                         the patient's blood pressure, pulse, and oxygen                         saturations were  monitored continuously. The                         Colonoscope was introduced through the anus and                         advanced to the the cecum, identified by the                         appendiceal orifice. The colonoscopy was performed                         with ease. The patient tolerated the procedure well.                         The quality of the bowel preparation was excellent.                         The ileocecal valve, appendiceal orifice, and rectum                         were photographed. Findings:      The perianal and digital rectal examinations were normal.      Multiple small-mouthed diverticula were found in the sigmoid colon.      The exam was otherwise without abnormality on direct and retroflexion       views. Impression:            - Diverticulosis in the sigmoid colon.                        - The examination was otherwise normal on direct and                         retroflexion views.                        - No specimens collected. Recommendation:        - Discharge patient to home (with escort).                        - Resume previous diet.                        - Continue present medications.                        - Repeat colonoscopy in 10 years for surveillance. Procedure Code(s):     --- Professional ---  16109, Colonoscopy, flexible; diagnostic, including                         collection of specimen(s) by brushing or washing, when                         performed (separate procedure) Diagnosis Code(s):     --- Professional ---                        Z86.010, Personal history of colonic polyps                        K57.30, Diverticulosis of large intestine without                         perforation or abscess without bleeding CPT copyright 2022 American Medical Association. All rights reserved. The codes documented in this report are preliminary and upon coder review may  be revised to meet current compliance  requirements. Wyline Mood, MD Wyline Mood MD, MD 02/01/2023 10:37:38 AM This report has been signed electronically. Number of Addenda: 0 Note Initiated On: 02/01/2023 10:03 AM Scope Withdrawal Time: 0 hours 11 minutes 32 seconds  Total Procedure Duration: 0 hours 17 minutes 33 seconds  Estimated Blood Loss:  Estimated blood loss: none.      [facility]

## 2023-02-01 NOTE — H&P (Signed)
Wyline Mood, MD [address], [address], [place], [place], [address] [address], [place], [place], [address] Phone: [phone]  Fax: [phone]  Primary Care Physician:  Danelle Berry, PA-C   Pre-Procedure History & Physical: HPI:  Kristina Christian is a 67 y.o. female is here for an colonoscopy.   Past Medical History:  Diagnosis Date   Acute upper respiratory infection    Allergy    Arthritis    fingers   Chronic bronchitis (HCC) 06/11/2017   COPD (chronic obstructive pulmonary disease) (HCC) Seasonal   bronchitis   Fibrocystic breast    Nodule of finger of both hands 09/19/2015   Osteoporosis    Sebaceous cyst    Sun-induced skin changes, keratosis 03/22/2015   Synovial cyst    Varicose veins of lower extremity     Past Surgical History:  Procedure Laterality Date   COLONOSCOPY WITH PROPOFOL N/A 05/20/2018   Procedure: COLONOSCOPY WITH PROPOFOL;  Surgeon: Wyline Mood, MD;  Location: [facility] ENDOSCOPY;  Service: Gastroenterology;  Laterality: N/A;   TONSILLECTOMY AND ADENOIDECTOMY  04/22/2007    Prior to Admission medications   Medication Sig Start Date End Date Taking? Authorizing Provider  albuterol (PROVENTIL HFA;VENTOLIN HFA) 108 (90 Base) MCG/ACT inhaler Inhale 2 puffs every 4 (four) hours as needed into the lungs for wheezing or shortness of breath. 06/07/17   Kerman Passey, MD  b complex vitamins capsule Take 1 capsule by mouth daily.    [provider]  calcium carbonate (OS-CAL) 600 MG TABS tablet Take 500 mg by mouth 2 (two) times daily.     [provider]  cholecalciferol (VITAMIN D) 1000 units tablet Take 1,000 Units by mouth daily.    [provider]  COVID-19 mRNA bivalent vaccine, Pfizer, (PFIZER COVID-19 VAC BIVALENT) injection Inject into the muscle. 12/05/21   Judyann Munson, MD  COVID-19 mRNA vaccine [phone] (COMIRNATY) syringe Inject into the muscle. 09/05/22   Judyann Munson, MD  gabapentin (NEURONTIN) 100 MG  capsule Take 1 capsule (100 mg total) by mouth at bedtime. 12/11/22   Danelle Berry, PA-C  GAVILYTE-G 236 g solution SMARTSIG:Milliliter(s) By Mouth Patient not taking: Reported on 12/11/2022 11/07/22   [provider]  levocetirizine (XYZAL) 5 MG tablet Take 5 mg by mouth as needed for allergies.    [provider]  magnesium oxide (MAG-OX) 400 MG tablet Take 400 mg by mouth daily.    [provider]  MULTIPLE VITAMINS PO Take daily by mouth.     [provider]  Nutritional Supplements (BOOST VERY HIGH CALORIE) LIQD Take 1 each by mouth daily. 12/11/22   Danelle Berry, PA-C  vitamin C (ASCORBIC ACID) 500 MG tablet Take 500 mg by mouth daily.    [provider]  zinc gluconate 50 MG tablet Take 50 mg by mouth daily.    [provider]    Allergies as of 11/07/2022 - Review Complete 11/07/2022  Allergen Reaction Noted   Breo ellipta [fluticasone furoate-vilanterol]  06/17/2017    Family History  Problem Relation Age of Onset   Von Willebrand disease Mother    Osteoporosis Mother    Arthritis Mother    Hearing loss Mother    Prostate cancer Brother    Cancer Brother    Bladder Cancer Maternal Grandfather    Cancer Maternal Grandfather    Asthma Sister     Social History   Socioeconomic History   Marital status: Married    Spouse name: Mathis Fare  Number of children: 2   Years of education: Not on file   Highest education level: Not on file  Occupational History   Not on file  Tobacco Use   Smoking status: Never   Smokeless tobacco: Never  Vaping Use   Vaping status: Never Used  Substance and Sexual Activity   Alcohol use: Never   Drug use: Never   Sexual activity: Yes    Partners: Male    Birth control/protection: Post-menopausal  Other Topics Concern   Not on file  Social History Narrative   Not on file   Social Determinants of Health   Financial Resource Strain: Low Risk  (09/27/2022)   Overall Financial Resource  Strain (CARDIA)    Difficulty of Paying Living Expenses: Not hard at all  Food Insecurity: No Food Insecurity (09/27/2022)   Hunger Vital Sign    Worried About Running Out of Food in the Last Year: Never true    Ran Out of Food in the Last Year: Never true  Transportation Needs: No Transportation Needs (09/27/2022)   PRAPARE - Administrator, Civil Service (Medical): No    Lack of Transportation (Non-Medical): No  Physical Activity: Inactive (09/27/2022)   Exercise Vital Sign    Days of Exercise per Week: 0 days    Minutes of Exercise per Session: 0 min  Stress: Stress Concern Present (09/27/2022)   [employer] of Occupational Health - Occupational Stress Questionnaire    Feeling of Stress : To some extent  Social Connections: Socially Integrated (09/27/2022)   Social Connection and Isolation Panel [NHANES]    Frequency of Communication with Friends and Family: More than three times a week    Frequency of Social Gatherings with Friends and Family: More than three times a week    Attends Religious Services: 1 to 4 times per year    Active Member of [employer] or Organizations: Yes    Attends Banker Meetings: 1 to 4 times per year    Marital Status: Married  Catering manager Violence: Not At Risk (09/27/2022)   Humiliation, Afraid, Rape, and Kick questionnaire    Fear of Current or Ex-Partner: No    Emotionally Abused: No    Physically Abused: No    Sexually Abused: No    Review of Systems: See HPI, otherwise negative ROS  Physical Exam: There were no vitals taken for this visit. General:   Alert,  pleasant and cooperative in NAD Head:  Normocephalic and atraumatic. Neck:  Supple; no masses or thyromegaly. Lungs:  Clear throughout to auscultation, normal respiratory effort.    Heart:  +S1, +S2, Regular rate and rhythm, No edema. Abdomen:  Soft, nontender and nondistended. Normal bowel sounds, without guarding, and without rebound.   Neurologic:  Alert and   oriented x4;  grossly normal neurologically.  Impression/Plan: Kristina Christian is here for an colonoscopy to be performed for surveillance due to prior history of colon polyps   Risks, benefits, limitations, and alternatives regarding  colonoscopy have been reviewed with the patient.  Questions have been answered.  All parties agreeable.   Wyline Mood, MD  02/01/2023, 9:13 AM

## 2023-06-03 ENCOUNTER — Other Ambulatory Visit: Payer: Self-pay

## 2023-06-03 MED ORDER — COMIRNATY 30 MCG/0.3ML IM SUSY
0.3000 mL | PREFILLED_SYRINGE | Freq: Once | INTRAMUSCULAR | 0 refills | Status: AC
  Filled 2023-06-03: qty 0.3, 1d supply, fill #0

## 2023-06-27 DIAGNOSIS — E46 Unspecified protein-calorie malnutrition: Secondary | ICD-10-CM | POA: Diagnosis not present

## 2023-06-27 DIAGNOSIS — R636 Underweight: Secondary | ICD-10-CM | POA: Diagnosis not present

## 2023-06-27 DIAGNOSIS — E559 Vitamin D deficiency, unspecified: Secondary | ICD-10-CM | POA: Diagnosis not present

## 2023-09-19 DIAGNOSIS — K080 Exfoliation of teeth due to systemic causes: Secondary | ICD-10-CM | POA: Diagnosis not present

## 2023-10-03 ENCOUNTER — Ambulatory Visit: Payer: Medicare HMO

## 2023-10-03 VITALS — BP 128/70 | Ht 63.75 in | Wt 97.8 lb

## 2023-10-03 DIAGNOSIS — Z0000 Encounter for general adult medical examination without abnormal findings: Secondary | ICD-10-CM

## 2023-10-03 NOTE — Patient Instructions (Addendum)
 Kristina Christian , Thank you for taking time to come for your Medicare Wellness Visit. I appreciate your ongoing commitment to your health goals. Please review the following plan we discussed and let me know if I can assist you in the future.   Referrals/Orders/Follow-Ups/Clinician Recommendations: NONE  This is a list of the screening recommended for you and due dates:  Health Maintenance  Topic Date Due   Zoster (Shingles) Vaccine (1 of 2) Never done   Mammogram  10/05/2018   Flu Shot  10/21/2023*   COVID-19 Vaccine (7 - 2024-25 season) 12/01/2023   DEXA scan (bone density measurement)  07/25/2024   Medicare Annual Wellness Visit  10/02/2024   DTaP/Tdap/Td vaccine (4 - Td or Tdap) 01/07/2028   Colon Cancer Screening  01/31/2033   Pneumonia Vaccine  Completed   Hepatitis C Screening  Completed   HPV Vaccine  Aged Out  *Topic was postponed. The date shown is not the original due date.    Advanced directives: (ACP Link)Information on Advanced Care Planning can be found at [facility] Advance Health Care Directives Advance Health Care Directives. [email or web address]   Next Medicare Annual Wellness Visit scheduled for next year: Yes   10/08/24 @ 2:30 PM IN PERSON

## 2023-10-03 NOTE — Progress Notes (Signed)
 Subjective:   Darrian Shvartsman is a 68 y.o. who presents for a Medicare Wellness preventive visit.  Visit Complete: In person   Persons Participating in Visit: Patient.  AWV Questionnaire: No: Patient Medicare AWV questionnaire was not completed prior to this visit.  Cardiac Risk Factors include: advanced age (>48men, >8 women)     Objective:    Today's Vitals   10/03/23 1430  BP: 128/70  Weight: 97 lb 12.8 oz (44.4 kg)  Height: 5' 3.75" (1.619 m)   Body mass index is 16.92 kg/m.     10/03/2023    2:42 PM 09/27/2022    2:24 PM 05/06/2020    6:25 PM 05/20/2018    7:48 AM 08/22/2016    8:15 AM 09/19/2015    9:30 AM 03/22/2015    3:14 PM  Advanced Directives  Does Patient Have a Medical Advance Directive? No No No No No No No  Would patient like information on creating a medical advance directive? No - Patient declined Yes (ED - Information included in AVS) No - Patient declined        Current Medications (verified) Outpatient Encounter Medications as of 10/03/2023  Medication Sig   albuterol (PROVENTIL HFA;VENTOLIN HFA) 108 (90 Base) MCG/ACT inhaler Inhale 2 puffs every 4 (four) hours as needed into the lungs for wheezing or shortness of breath.   b complex vitamins capsule Take 1 capsule by mouth daily.   calcium carbonate (OS-CAL) 600 MG TABS tablet Take 500 mg by mouth 2 (two) times daily.    cholecalciferol (VITAMIN D) 1000 units tablet Take 1,000 Units by mouth daily.   COVID-19 mRNA bivalent vaccine, Pfizer, (PFIZER COVID-19 VAC BIVALENT) injection Inject into the muscle.   COVID-19 mRNA vaccine 2023-2024 (COMIRNATY) syringe Inject into the muscle.   denosumab (PROLIA) 60 MG/ML SOSY injection Inject 60 mg into the skin every 6 (six) months.   levocetirizine (XYZAL) 5 MG tablet Take 5 mg by mouth as needed for allergies.   magnesium oxide (MAG-OX) 400 MG tablet Take 400 mg by mouth daily.   Menaquinone-7 (VITAMIN K2) 100 MCG CAPS Take 100 mcg by mouth daily.    MULTIPLE VITAMINS PO Take daily by mouth.    Nutritional Supplements (BOOST VERY HIGH CALORIE) LIQD Take 1 each by mouth daily.   vitamin C (ASCORBIC ACID) 500 MG tablet Take 500 mg by mouth daily.   zinc gluconate 50 MG tablet Take 50 mg by mouth daily.   [DISCONTINUED] gabapentin (NEURONTIN) 100 MG capsule Take 1 capsule (100 mg total) by mouth at bedtime.   [DISCONTINUED] GAVILYTE-G 236 g solution SMARTSIG:Milliliter(s) By Mouth (Patient not taking: Reported on 12/11/2022)   No facility-administered encounter medications on file as of 10/03/2023.    Allergies (verified) Breo ellipta [fluticasone furoate-vilanterol]   History: Past Medical History:  Diagnosis Date   Acute upper respiratory infection    Allergy    Arthritis    fingers   Chronic bronchitis (HCC) 06/11/2017   COPD (chronic obstructive pulmonary disease) (HCC) Seasonal   bronchitis   Fibrocystic breast    Nodule of finger of both hands 09/19/2015   Osteoporosis    Sebaceous cyst    Sun-induced skin changes, keratosis 03/22/2015   Synovial cyst    Varicose veins of lower extremity    Past Surgical History:  Procedure Laterality Date   COLONOSCOPY WITH PROPOFOL N/A 05/20/2018   Procedure: COLONOSCOPY WITH PROPOFOL;  Surgeon: Wyline Mood, MD;  Location: [facility] ENDOSCOPY;  Service: Gastroenterology;  Laterality: N/A;  COLONOSCOPY WITH PROPOFOL N/A 02/01/2023   Procedure: COLONOSCOPY WITH PROPOFOL;  Surgeon: Wyline Mood, MD;  Location: [facility] ENDOSCOPY;  Service: Gastroenterology;  Laterality: N/A;   TONSILLECTOMY AND ADENOIDECTOMY  04/22/2007   Family History  Problem Relation Age of Onset   Von Willebrand disease Mother    Osteoporosis Mother    Arthritis Mother    Hearing loss Mother    Prostate cancer Brother    Cancer Brother    Bladder Cancer Maternal Grandfather    Cancer Maternal Grandfather    Asthma Sister    Social History   Socioeconomic History   Marital status: Married    Spouse name: Mathis Fare    Number of children: 2   Years of education: Not on file   Highest education level: Not on file  Occupational History   Not on file  Tobacco Use   Smoking status: Never   Smokeless tobacco: Never  Vaping Use   Vaping status: Never Used  Substance and Sexual Activity   Alcohol use: Never   Drug use: Never   Sexual activity: Yes    Partners: Male    Birth control/protection: Post-menopausal  Other Topics Concern   Not on file  Social History Narrative   Not on file   Social Drivers of Health   Financial Resource Strain: Low Risk  (10/03/2023)   Overall Financial Resource Strain (CARDIA)    Difficulty of Paying Living Expenses: Not hard at all  Food Insecurity: No Food Insecurity (10/03/2023)   Hunger Vital Sign    Worried About Running Out of Food in the Last Year: Never true    Ran Out of Food in the Last Year: Never true  Transportation Needs: No Transportation Needs (10/03/2023)   PRAPARE - Administrator, Civil Service (Medical): No    Lack of Transportation (Non-Medical): No  Physical Activity: Sufficiently Active (10/03/2023)   Exercise Vital Sign    Days of Exercise per Week: 5 days    Minutes of Exercise per Session: 30 min  Recent Concern: Physical Activity - Insufficiently Active (10/03/2023)   Exercise Vital Sign    Days of Exercise per Week: 4 days    Minutes of Exercise per Session: 30 min  Stress: No Stress Concern Present (10/03/2023)   [employer] of Occupational Health - Occupational Stress Questionnaire    Feeling of Stress : Only a little  Social Connections: Moderately Integrated (10/03/2023)   Social Connection and Isolation Panel [NHANES]    Frequency of Communication with Friends and Family: More than three times a week    Frequency of Social Gatherings with Friends and Family: More than three times a week    Attends Religious Services: Never    Database administrator or Organizations: Yes    Attends Engineer, structural: More  than 4 times per year    Marital Status: Married    Tobacco Counseling Counseling given: Not Answered    Clinical Intake:  Pre-visit preparation completed: Yes  Pain : No/denies pain     BMI - recorded: 16.92 Nutritional Status: BMI <19  Underweight Nutritional Risks: None Diabetes: No  How often do you need to have someone help you when you read instructions, pamphlets, or other written materials from your doctor or pharmacy?: 1 - Never  Interpreter Needed?: No  Information entered by :: Kennedy Bucker, LPN   Activities of Daily Living     10/03/2023    2:44 PM 10/03/2023   12:56 PM  In your present state of health, do you have any difficulty performing the following activities:  Hearing? 0 0  Vision? 0 0  Difficulty concentrating or making decisions? 0 0  Walking or climbing stairs? 0 0  Dressing or bathing? 0 0  Doing errands, shopping? 0 0  Preparing Food and eating ? N N  Using the Toilet? N N  In the past six months, have you accidently leaked urine? N N  Do you have problems with loss of bowel control? N N  Managing your Medications? N N  Managing your Finances? N N  Housekeeping or managing your Housekeeping? N N    Patient Care Team: Danelle Berry, PA-C as PCP - General (Family Medicine) [place] Crane, MD as Consulting Physician (Ophthalmology)  Indicate any recent Medical Services you may have received from other than [facility] providers in the past year (date may be approximate).     Assessment:   This is a routine wellness examination for Teofila.  Hearing/Vision screen Hearing Screening - Comments:: NO AIDS Vision Screening - Comments:: READERS- DR.KING  EYE   Goals Addressed             This Visit's Progress    DIET - EAT MORE FRUITS AND VEGETABLES         Depression Screen     10/03/2023    2:41 PM 12/11/2022   10:54 AM 09/27/2022    2:14 PM 08/02/2022    1:03 PM 08/03/2021    8:06 AM 04/06/2021    8:48 AM 02/21/2021   11:30  AM  PHQ 2/9 Scores  PHQ - 2 Score 0 0 0 0 0 0 0  PHQ- 9 Score 0 0   0 0 0    Fall Risk     10/03/2023    2:44 PM 10/03/2023   12:56 PM 12/11/2022   10:54 AM 09/27/2022    2:07 PM 08/02/2022    1:03 PM  Fall Risk   Falls in the past year? 0 0 0 0 0  Number falls in past yr: 0 0 0 0 0  Injury with Fall? 0 0 0 0 0  Risk for fall due to : No Fall Risks  No Fall Risks No Fall Risks   Follow up Falls prevention discussed;Falls evaluation completed  Falls prevention discussed;Education provided;Falls evaluation completed Education provided;Falls prevention discussed Falls evaluation completed    MEDICARE RISK AT HOME:  Medicare Risk at Home Any stairs in or around the home?: Yes If so, are there any without handrails?: No Home free of loose throw rugs in walkways, pet beds, electrical cords, etc?: Yes Adequate lighting in your home to reduce risk of falls?: Yes Life alert?: No Use of a cane, walker or w/c?: No Grab bars in the bathroom?: No Shower chair or bench in shower?: No Elevated toilet seat or a handicapped toilet?: No  TIMED UP AND GO:  Was the test performed?  Yes  Length of time to ambulate 10 feet: 4 sec Gait steady and fast without use of assistive device  Cognitive Function: 6CIT completed        10/03/2023    2:51 PM 09/27/2022    2:25 PM  6CIT Screen  What Year? 0 points 0 points  What month? 0 points 0 points  What time? 0 points 0 points  Count back from 20 0 points 0 points  Months in reverse 0 points 0 points  Repeat phrase 0 points 0 points  Total  Score 0 points 0 points    Immunizations Immunization History  Administered Date(s) Administered   PFIZER Comirnaty(Gray Top)Covid-19 Tri-Sucrose Vaccine 08/22/2020   PFIZER(Purple Top)SARS-COV-2 Vaccination 12/05/2019, 12/29/2019   PNEUMOCOCCAL CONJUGATE-20 04/27/2022   Pfizer Covid-19 Vaccine Bivalent Booster 58yrs & up 12/05/2021   Pfizer(Comirnaty)Fall Seasonal Vaccine 12 years and older 09/05/2022,  06/03/2023   Tdap 04/22/2007, 01/06/2018   Tetanus 04/22/2007    Screening Tests Health Maintenance  Topic Date Due   Zoster Vaccines- Shingrix (1 of 2) Never done   MAMMOGRAM  10/05/2018   INFLUENZA VACCINE  10/21/2023 (Originally 02/21/2023)   COVID-19 Vaccine (7 - 2024-25 season) 12/01/2023   DEXA SCAN  07/25/2024   Medicare Annual Wellness (AWV)  10/02/2024   DTaP/Tdap/Td (4 - Td or Tdap) 01/07/2028   Colonoscopy  01/31/2033   Pneumonia Vaccine 47+ Years old  Completed   Hepatitis C Screening  Completed   HPV VACCINES  Aged Out    Health Maintenance  Health Maintenance Due  Topic Date Due   Zoster Vaccines- Shingrix (1 of 2) Never done   MAMMOGRAM  10/05/2018   Health Maintenance Items Addressed: UP TO DATE, NO FLU SHOTS (DECLINED)  Additional Screening:  Vision Screening: Recommended annual ophthalmology exams for early detection of glaucoma and other disorders of the eye.  Dental Screening: Recommended annual dental exams for proper oral hygiene  Community Resource Referral / Chronic Care Management: CRR required this visit?  No   CCM required this visit?  No     Plan:     I have personally reviewed and noted the following in the patient's chart:   Medical and social history Use of alcohol, tobacco or illicit drugs  Current medications and supplements including opioid prescriptions. Patient is not currently taking opioid prescriptions. Functional ability and status Nutritional status Physical activity Advanced directives List of other physicians Hospitalizations, surgeries, and ER visits in previous 12 months Vitals Screenings to include cognitive, depression, and falls Referrals and appointments  In addition, I have reviewed and discussed with patient certain preventive protocols, quality metrics, and best practice recommendations. A written personalized care plan for preventive services as well as general preventive health recommendations were  provided to patient.     Hal Hope, LPN   [id number]/1191   After Visit Summary: (In Person-Declined) Patient declined AVS at this time.  Notes: Nothing significant to report at this time.

## 2023-12-26 DIAGNOSIS — E46 Unspecified protein-calorie malnutrition: Secondary | ICD-10-CM | POA: Diagnosis not present

## 2023-12-26 DIAGNOSIS — M810 Age-related osteoporosis without current pathological fracture: Secondary | ICD-10-CM | POA: Diagnosis not present

## 2023-12-26 DIAGNOSIS — Z636 Dependent relative needing care at home: Secondary | ICD-10-CM | POA: Diagnosis not present

## 2023-12-26 DIAGNOSIS — R636 Underweight: Secondary | ICD-10-CM | POA: Diagnosis not present

## 2024-02-11 ENCOUNTER — Ambulatory Visit: Admitting: Internal Medicine

## 2024-02-11 ENCOUNTER — Ambulatory Visit: Payer: Self-pay

## 2024-02-11 ENCOUNTER — Other Ambulatory Visit: Payer: Self-pay

## 2024-02-11 ENCOUNTER — Encounter: Payer: Self-pay | Admitting: Internal Medicine

## 2024-02-11 VITALS — BP 122/68 | HR 86 | Temp 98.0°F | Resp 18 | Ht 63.75 in | Wt 92.6 lb

## 2024-02-11 DIAGNOSIS — S40861A Insect bite (nonvenomous) of right upper arm, initial encounter: Secondary | ICD-10-CM | POA: Diagnosis not present

## 2024-02-11 DIAGNOSIS — W57XXXA Bitten or stung by nonvenomous insect and other nonvenomous arthropods, initial encounter: Secondary | ICD-10-CM | POA: Diagnosis not present

## 2024-02-11 MED ORDER — DOXYCYCLINE HYCLATE 100 MG PO TABS
100.0000 mg | ORAL_TABLET | Freq: Two times a day (BID) | ORAL | 0 refills | Status: AC
Start: 2024-02-11 — End: 2024-02-18

## 2024-02-11 NOTE — Telephone Encounter (Signed)
 FYI Only or Action Required?: FYI only for provider.  Patient was last seen in primary care on 08/02/2022 by Gareth Mliss FALCON, FNP.  Called Nurse Triage reporting Insect Bite.  Symptoms began several days ago. Red itchy.   Interventions attempted: OTC medications: Apple cider vinegar, Hydrocortisone  cream.  Symptoms are: gradually worsening.  Triage Disposition: See PCP When Office is Open (Within 3 Days)  Patient/caregiver understands and will follow disposition?: Yes                 Copied from CRM [id number]. Topic: Clinical - Red Word Triage >> Feb 11, 2024  8:01 AM Carlatta H wrote: [facility] that prompted transfer to Nurse Triage: Patient was bitten by something 2 days ago and its red and now the area is spreading// Reason for Disposition  Bite starts to look bad (e.g., blister, purplish skin, ulcer)  (Exception: There is just minor swelling or small red bump.)  Answer Assessment - Initial Assessment Questions 1. TYPE of INSECT: What type of insect was it?      Unsure 2. ONSET: When did you get bitten?      Past 2 weeks 3. LOCATION: Where is the insect bite located?      On thigh of right leg, below knee 4. REDNESS: Is the area red or pink? If Yes, ask: What size is the area of redness? (inches or cm). When did the redness start?     Yes,  5. PAIN: Is there any pain? If Yes, ask: How bad is the pain? (Scale 0-10; or none, mild, moderate, severe)     no 6. ITCHING: Does it itch? If Yes, ask: How bad is the itch?      yes 7. SWELLING: How big is the swelling? (e.g., inches, cm, or compare to coins)     no 8. OTHER SYMPTOMS: Do you have any other symptoms?  (e.g., difficulty breathing, fever, hives)     No - red area on thigh is 1.5 inches. The one on calf is smaller.  Protocols used: Insect Bite-A-AH

## 2024-02-11 NOTE — Progress Notes (Signed)
 Acute Office Visit  Subjective:     Patient ID: Kristina Christian, female    DOB: Dec 18, 1955, 68 y.o.   MRN: [id number]  Chief Complaint  Patient presents with   Insect Bite    HPI Patient is in today for bug bite.   Discussed the use of AI scribe software for clinical note transcription with the patient, who gave verbal consent to proceed.  History of Present Illness Kristina Christian is a 68 year old female who presents with skin lesions suspected to be insect bites.  She developed skin lesions two weeks ago, initially thought to be flea bites. The first lesion appeared during sleep and resolved after treatment with lidocaine . Additional lesions have appeared every few days, primarily while sleeping, leading her to wash and sanitize her bedding extensively. She frequently visits her mother's house, where there is an indoor cat that occasionally goes outside, which she suspects may be the source of the bites.  The lesions appear on various parts of her body, including her breast and leg, and are larger and redder than typical flea bites. One lesion has a streak-like appearance. She has used antihistamines and topical treatments like lidocaine  and calamine lotion to manage itching and swelling, but the lesions continue to appear and grow in size.  She has washed her custom neck pillow in hot water as part of her cleaning efforts. She has no known allergies to antibiotics and has previously taken doxycycline  without issues.    Review of Systems  Constitutional:  Negative for chills and fever.  Skin:  Positive for itching and rash.        Objective:    BP 122/68 (Cuff Size: Small)   Pulse 86   Temp 98 F (36.7 C) (Oral)   Resp 18   Ht 5' 3.75 (1.619 m)   Wt 92 lb 9.6 oz (42 kg)   SpO2 97%   BMI 16.02 kg/m  BP Readings from Last 3 Encounters:  02/11/24 122/68  10/03/23 128/70  02/01/23 (!) 98/56   Wt Readings from Last 3 Encounters:  02/11/24 92 lb 9.6 oz (42 kg)   10/03/23 97 lb 12.8 oz (44.4 kg)  02/01/23 92 lb 6.4 oz (41.9 kg)      Physical Exam Constitutional:      Appearance: Normal appearance.  HENT:     Head: Normocephalic and atraumatic.  Eyes:     Conjunctiva/sclera: Conjunctivae normal.  Cardiovascular:     Rate and Rhythm: Normal rate and regular rhythm.  Pulmonary:     Effort: Pulmonary effort is normal.     Breath sounds: Normal breath sounds.  Skin:    General: Skin is warm and dry.     Findings: Rash present.     Comments: 2 smaller insect bites on right arm and side of left breast but a larger bit on right calf and right thigh with large area of induration and erythema, warm to the touch, no drainage   Neurological:     General: No focal deficit present.     Mental Status: She is alert. Mental status is at baseline.  Psychiatric:        Mood and Affect: Mood normal.        Behavior: Behavior normal.     No results found for any visits on 02/11/24.      Assessment & Plan:   Assessment & Plan Insect bites with possible allergic reaction Lesions suggest allergic reaction to insect venom, possibly from spider bites.  Exposure likely from cat at [employer]. - Prescribed doxycycline  100 mg BID for 7 days. - Advised daily antihistamines for pruritus and allergic reaction. - Recommended calamine lotion for skin soothing. - Suggested lidocaine  for numbing if effective. - Instructed to wash bedding and clothing in hot water, dry on high heat. - Advised placing large items in trash bags for 3 days to eliminate insects. - Cautioned about doxycycline  side effects: photosensitivity, nausea; advised protective clothing, taking with food.  - doxycycline  (VIBRA -TABS) 100 MG tablet; Take 1 tablet (100 mg total) by mouth 2 (two) times daily for 7 days.  Dispense: 14 tablet; Refill: 0   Return if symptoms worsen or fail to improve.  Sharyle Fischer, DO

## 2024-03-26 DIAGNOSIS — H2513 Age-related nuclear cataract, bilateral: Secondary | ICD-10-CM | POA: Diagnosis not present

## 2024-03-26 DIAGNOSIS — H43811 Vitreous degeneration, right eye: Secondary | ICD-10-CM | POA: Diagnosis not present

## 2024-03-26 DIAGNOSIS — H538 Other visual disturbances: Secondary | ICD-10-CM | POA: Diagnosis not present

## 2024-03-31 DIAGNOSIS — K080 Exfoliation of teeth due to systemic causes: Secondary | ICD-10-CM | POA: Diagnosis not present

## 2024-06-03 ENCOUNTER — Other Ambulatory Visit: Payer: Self-pay

## 2024-06-03 MED ORDER — COMIRNATY 30 MCG/0.3ML IM SUSY
0.3000 mL | PREFILLED_SYRINGE | Freq: Once | INTRAMUSCULAR | 0 refills | Status: AC
  Filled 2024-06-03: qty 0.3, 1d supply, fill #0

## 2024-06-05 ENCOUNTER — Other Ambulatory Visit: Payer: Self-pay

## 2024-06-30 DIAGNOSIS — E46 Unspecified protein-calorie malnutrition: Secondary | ICD-10-CM | POA: Diagnosis not present

## 2024-06-30 DIAGNOSIS — R636 Underweight: Secondary | ICD-10-CM | POA: Diagnosis not present

## 2024-06-30 DIAGNOSIS — M810 Age-related osteoporosis without current pathological fracture: Secondary | ICD-10-CM | POA: Diagnosis not present

## 2024-06-30 DIAGNOSIS — E559 Vitamin D deficiency, unspecified: Secondary | ICD-10-CM | POA: Diagnosis not present

## 2024-07-13 ENCOUNTER — Other Ambulatory Visit: Payer: Self-pay | Admitting: Internal Medicine

## 2024-07-13 DIAGNOSIS — M810 Age-related osteoporosis without current pathological fracture: Secondary | ICD-10-CM

## 2024-07-30 ENCOUNTER — Ambulatory Visit
Admission: RE | Admit: 2024-07-30 | Discharge: 2024-07-30 | Disposition: A | Discharge status: Home / Self Care | Source: Ambulatory Visit | Attending: Internal Medicine | Admitting: Internal Medicine

## 2024-07-30 ENCOUNTER — Other Ambulatory Visit: Payer: Self-pay | Admitting: Internal Medicine

## 2024-07-30 DIAGNOSIS — M810 Age-related osteoporosis without current pathological fracture: Secondary | ICD-10-CM | POA: Insufficient documentation

## 2024-07-30 DIAGNOSIS — Z1231 Encounter for screening mammogram for malignant neoplasm of breast: Secondary | ICD-10-CM

## 2024-08-03 ENCOUNTER — Ambulatory Visit: Payer: Self-pay | Admitting: Internal Medicine

## 2024-08-04 ENCOUNTER — Ambulatory Visit: Payer: Self-pay

## 2024-08-04 NOTE — Telephone Encounter (Signed)
 FYI Only or Action Required?: FYI only for provider: appointment scheduled on 08/05/2024.  Patient was last seen in primary care on 02/11/2024 by Bernardo Fend, DO.  Called Nurse Triage reporting Jaw Pain  Symptoms began yesterday.  Triage Disposition: See Physician Within 24 Hours  Patient/caregiver understands and will follow disposition?: Yes         Copied from CRM [id number]. Topic: Clinical - Red Word Triage >> Aug 04, 2024  9:24 AM Zy'onna H wrote: Red Word that prompted transfer to Nurse Triage:  Patient previously had neck pain - stiff neck (Started Thursday - still slight stiffness today) **Don't think they're connected** **Patient has Osteoporosis**  Pain now in her Jaw (began yesterday) - currently on Prolia  medication   Looking to schedule appt.  **WM Transf to NT** Reason for Disposition  [1] MODERATE pain (e.g., interferes with normal activities) AND [2] constant AND [3] present > 24 hours  Answer Assessment - Initial Assessment Questions This RN scheduled pt an appointment tomorrow, 1/14, in office. This RN educated pt on new-worsening symptoms and when to call back/seek emergent care. Pt verbalized understanding and agrees to plan.    Symptoms: Stiff neck started last Thurs (very slight), has improved Jaw pain on right started yesterday, constant (with mouth closed or talking 1/10 pain level, with eating/yawning can go up to a 6/10 pain level); has not had before Ear congestion for 2 weeks   Denies: Chest pain Difficulty breathing Headache Fever Swelling   Pt takes Prolia  Hx of osteoporosis  Protocols used: Face Pain-A-AH

## 2024-08-05 ENCOUNTER — Encounter: Payer: Self-pay | Admitting: Family Medicine

## 2024-08-05 ENCOUNTER — Ambulatory Visit: Admitting: Family Medicine

## 2024-08-05 VITALS — BP 144/74 | HR 80 | Resp 16 | Ht 63.75 in | Wt 92.3 lb

## 2024-08-05 DIAGNOSIS — H6993 Unspecified Eustachian tube disorder, bilateral: Secondary | ICD-10-CM

## 2024-08-05 DIAGNOSIS — R6884 Jaw pain: Secondary | ICD-10-CM | POA: Diagnosis not present

## 2024-08-05 DIAGNOSIS — R030 Elevated blood-pressure reading, without diagnosis of hypertension: Secondary | ICD-10-CM

## 2024-08-05 DIAGNOSIS — M542 Cervicalgia: Secondary | ICD-10-CM | POA: Diagnosis not present

## 2024-08-05 NOTE — Progress Notes (Signed)
 Name: Kristina Christian   MRN: [id number]    DOB: 21-Aug-1955   Date:08/05/2024       Progress Note  Subjective  Chief Complaint  Chief Complaint  Patient presents with   Jaw Pain    Started Sun, sx are better today   Neck Pain    Started last Wed   Discussed the use of AI scribe software for clinical note transcription with the patient, who gave verbal consent to proceed.  History of Present Illness Kristina Christian is a 69 year old female with osteoporosis who presents with neck stiffness and jaw pain.  She woke up last Thursday with neck stiffness, a symptom she occasionally experiences due to her osteoporosis. Typically, the stiffness resolves within two days, but this time it has persisted longer. The stiffness is located in her neck and is gradually improving.  On Sunday, she began experiencing jaw pain, which she associates with her use of Prolia . The jaw pain has been improving but still causes difficulty when chewing hard foods.  She has experienced a sensation of fullness in her ears for the past two weeks, which she attributes to possible water retention from showering. She describes hearing a noise when swallowing and has stopped using the Valsalva maneuver to clear her ears. She has not used her fluticasone  nasal spray recently  She mentions significant stress related to caring for her mother with dementia, which she believes may be contributing to her symptoms. She reports poor sleep quality, waking frequently due to leg cramps and the need to urinate. She also notes that her blood pressure is elevated in medical settings but normalizes at home, suggesting stress-related hypertension.    Patient Active Problem List   Diagnosis Date Noted   History of colonic polyps 02/01/2023   RLS (restless legs syndrome) 12/11/2022   Unintentional weight loss 02/21/2021   Protein-calorie malnutrition 02/21/2021   Venous stasis dermatitis of right lower extremity 10/06/2019   Spider vein of  lower extremity 01/06/2018   Chronic bronchitis (HCC) 06/11/2017   Osteoporosis 01/27/2015   Varicose veins of leg with pain, bilateral 01/27/2015   Allergic rhinitis, seasonal 01/27/2015    Past Surgical History:  Procedure Laterality Date   COLONOSCOPY WITH PROPOFOL  N/A 05/20/2018   Procedure: COLONOSCOPY WITH PROPOFOL ;  Surgeon: Therisa Bi, MD;  Location: [facility] ENDOSCOPY;  Service: Gastroenterology;  Laterality: N/A;   COLONOSCOPY WITH PROPOFOL  N/A 02/01/2023   Procedure: COLONOSCOPY WITH PROPOFOL ;  Surgeon: Therisa Bi, MD;  Location: [facility] ENDOSCOPY;  Service: Gastroenterology;  Laterality: N/A;   TONSILLECTOMY AND ADENOIDECTOMY  04/22/2007    Family History  Problem Relation Age of Onset   Von Willebrand disease Mother    Osteoporosis Mother    Arthritis Mother    Hearing loss Mother    Prostate cancer Brother    Cancer Brother    Bladder Cancer Maternal Grandfather    Cancer Maternal Grandfather    Asthma Sister     Social History   Tobacco Use   Smoking status: Never   Smokeless tobacco: Never  Substance Use Topics   Alcohol use: Never    Current Medications[1]  Allergies[2]  I personally reviewed active problem list, medication list, allergies with the patient/caregiver today.   ROS  Ten systems reviewed and is negative except as mentioned in HPI    Objective Physical Exam CONSTITUTIONAL: Patient appears well-developed and well-nourished. No distress. HEENT: Head atraumatic, normocephalic, neck supple. Jaw deviates to the right on opening. Cerumen present in ear, minimal in  one ear. CARDIOVASCULAR: Normal rate, regular rhythm and normal heart sounds. No murmur heard. No BLE edema. PULMONARY: Effort normal and breath sounds normal. Lungs clear to auscultation. No respiratory distress. ABDOMINAL: There is no tenderness or distention. MUSCULOSKELETAL: Normal gait. Without gross motor or sensory deficit. PSYCHIATRIC: Patient has a normal mood and affect.  Behavior is normal. Judgment and thought content normal.  Vitals:   08/05/24 1514 08/05/24 1555  BP: (!) 154/84 (!) 144/74  Pulse: 80   Resp: 16   SpO2: 98%   Weight: 92 lb 4.8 oz (41.9 kg)   Height: 5' 3.75 (1.619 m)     Body mass index is 15.97 kg/m.   PHQ2/9:    08/05/2024    3:14 PM 10/03/2023    2:41 PM 12/11/2022   10:54 AM 09/27/2022    2:14 PM 08/02/2022    1:03 PM  Depression screen PHQ 2/9  Decreased Interest 0 0 0 0 0  Down, Depressed, Hopeless 0 0 0 0 0  PHQ - 2 Score 0 0 0 0 0  Altered sleeping  0 0    Tired, decreased energy  0 0    Change in appetite  0 0    Feeling bad or failure about yourself   0 0    Trouble concentrating  0 0    Moving slowly or fidgety/restless  0 0    Suicidal thoughts  0 0    PHQ-9 Score  0  0     Difficult doing work/chores  Not difficult at all Not difficult at all       Data saved with a previous flowsheet row definition    phq 9 is negative  Fall Risk:    08/05/2024    3:14 PM 10/03/2023    2:44 PM 10/03/2023   12:56 PM 12/11/2022   10:54 AM 09/27/2022    2:07 PM  Fall Risk   Falls in the past year? 0 0 0  0 0  Number falls in past yr: 0 0 0  0 0  Injury with Fall? 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 Osteonecrosis of the jaw less likely. - Discuss with endocrinologist regarding Prolia  side effects. - Consider dental evaluation for TMJ and possible mouth guard.  Cervicalgia Neck stiffness likely related to tension and stress. - Advised stretching exercises and relaxation  techniques. - Recommended Tylenol  500 mg four times a day.  Eustachian tube dysfunction, bilateral Ear fullness and crackling likely due to Eustachian tube dysfunction, possibly stress-related. - Prescribed fluticasone  nasal spray nightly for one week. - Advised correct nasal spray application to avoid septum irritation.  White coat hypertension Elevated blood pressure in clinical settings likely due to stress and anxiety. - Monitor blood pressure at home. - Discussed stress management techniques and potential medication need.  General health maintenance Current COVID vaccination received. Regular follow-up with endocrinologist for Prolia  treatment. - Continue follow-up with endocrinologist for Prolia  treatment and blood work. - Ensure up-to-date dental care and discuss concerns with dentist.        [  1]  Current Outpatient Medications:    albuterol  (PROVENTIL  HFA;VENTOLIN  HFA) 108 (90 Base) MCG/ACT inhaler, Inhale 2 puffs every 4 (four) hours as needed into the lungs for wheezing or shortness of breath., Disp: 1 Inhaler, Rfl: 1   b complex vitamins capsule, Take 1 capsule by mouth daily., Disp: , Rfl:    calcium carbonate (OS-CAL) 600 MG TABS tablet, Take 500 mg by mouth 2 (two) times daily. , Disp: , Rfl:    cholecalciferol (VITAMIN D ) 1000 units tablet, Take 1,000 Units by mouth daily., Disp: , Rfl:    JUBBONTI 60 MG/ML SOSY injection, Inject 60 mg into the skin every 6 (six) months., Disp: , Rfl:    levocetirizine (XYZAL) 5 MG tablet, Take 5 mg by mouth as needed for allergies., Disp: , Rfl:    magnesium oxide (MAG-OX) 400 MG tablet, Take 400 mg by mouth daily., Disp: , Rfl:    Menaquinone-7 (VITAMIN K2) 100 MCG CAPS, Take 100 mcg by mouth daily., Disp: , Rfl:    MULTIPLE VITAMINS PO, Take daily by mouth. , Disp: , Rfl:    Nutritional Supplements (BOOST VERY HIGH CALORIE) LIQD, Take 1 each by mouth daily., Disp: 7110 mL, Rfl: 11   vitamin C (ASCORBIC ACID) 500 MG tablet, Take 500  mg by mouth daily., Disp: , Rfl:    zinc gluconate 50 MG tablet, Take 50 mg by mouth daily., Disp: , Rfl:  [2]  Allergies Allergen Reactions   Breo Ellipta  [Fluticasone  Furoate-Vilanterol]

## 2024-09-04 ENCOUNTER — Ambulatory Visit: Admitting: Nurse Practitioner

## 2024-10-08 ENCOUNTER — Ambulatory Visit
